# Patient Record
Sex: Female | Born: 1995 | Race: White | Hispanic: No | Marital: Single | Smoking: Current some day smoker
Health system: Southern US, Community
[De-identification: ages and names within clinical notes are randomized; demographics above are authoritative.]

## PROBLEM LIST (undated history)

## (undated) DIAGNOSIS — J45909 Unspecified asthma, uncomplicated: Secondary | ICD-10-CM

## (undated) DIAGNOSIS — F32A Depression, unspecified: Secondary | ICD-10-CM

## (undated) DIAGNOSIS — M419 Scoliosis, unspecified: Secondary | ICD-10-CM

## (undated) DIAGNOSIS — F419 Anxiety disorder, unspecified: Secondary | ICD-10-CM

## (undated) DIAGNOSIS — F429 Obsessive-compulsive disorder, unspecified: Secondary | ICD-10-CM

## (undated) DIAGNOSIS — N83209 Unspecified ovarian cyst, unspecified side: Secondary | ICD-10-CM

## (undated) DIAGNOSIS — D649 Anemia, unspecified: Secondary | ICD-10-CM

## (undated) DIAGNOSIS — F329 Major depressive disorder, single episode, unspecified: Secondary | ICD-10-CM

---
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
## 2016-02-25 ENCOUNTER — Encounter (HOSPITAL_COMMUNITY): Payer: Self-pay | Admitting: *Deleted

## 2016-02-25 ENCOUNTER — Emergency Department (HOSPITAL_COMMUNITY)
Admission: EM | Admit: 2016-02-25 | Discharge: 2016-02-25 | Disposition: A | Discharge status: Home / Self Care | Payer: No Typology Code available for payment source | Attending: Physician Assistant | Admitting: Physician Assistant

## 2016-02-25 DIAGNOSIS — J019 Acute sinusitis, unspecified: Secondary | ICD-10-CM | POA: Insufficient documentation

## 2016-02-25 DIAGNOSIS — J029 Acute pharyngitis, unspecified: Secondary | ICD-10-CM | POA: Insufficient documentation

## 2016-02-25 LAB — RAPID STREP SCREEN (MED CTR MEBANE ONLY): Streptococcus, Group A Screen (Direct): NEGATIVE

## 2016-02-25 MED ORDER — AMOXICILLIN-POT CLAVULANATE 875-125 MG PO TABS: 1.0000 | ORAL_TABLET | Freq: Two times a day (BID) | ORAL | Status: DC

## 2016-02-25 MED ORDER — ACETAMINOPHEN 325 MG PO TABS
ORAL_TABLET | ORAL | Status: AC
  Administered 2016-02-25: 650 mg
  Filled 2016-02-25: qty 2

## 2016-02-25 MED ORDER — MAGIC MOUTHWASH W/LIDOCAINE: 5.0000 mL | Freq: Four times a day (QID) | ORAL | Status: DC | PRN

## 2016-02-25 MED ORDER — IBUPROFEN 600 MG PO TABS: 600.0000 mg | ORAL_TABLET | Freq: Four times a day (QID) | ORAL | Status: DC | PRN

## 2016-02-25 MED ORDER — ACETAMINOPHEN 325 MG PO TABS: 650.0000 mg | ORAL_TABLET | Freq: Once | ORAL | Status: DC | PRN

## 2016-02-25 NOTE — ED Provider Notes (Signed)
CSN: [id number]     Arrival date & time 02/25/16  2005 History   First MD Initiated Contact with Patient 02/25/16 2119     Chief Complaint  Patient presents with  . Sore Throat  . Otalgia     (Consider location/radiation/quality/duration/timing/severity/associated sxs/prior Treatment) HPI Comments: 20 year old female presented to the emergency department for evaluation of sore throat. She states that her sore throat has been persistent for the past week. Symptoms have been associated with nasal congestion, otalgia, and rhinorrhea. She has also had a small amount of postnasal drip. Patient initially thought that her symptoms were secondary to allergies. She has been using Benadryl and Sudafed without relief. She reports a temperature of 101.36F 6 days ago. She did have a low-grade fever of 100.39F yesterday. Patient denies any inability to swallow or drooling. She has had a mild cough without shortness of breath. No vomiting or diarrhea recently. No reported sick contacts.  Patient is a 20 y.o. female presenting with pharyngitis and ear pain. The history is provided by the patient. No language interpreter was used.  Sore Throat Associated symptoms include congestion, a fever (Tmax 101.36F) and a sore throat.  Otalgia Associated symptoms: congestion, fever (Tmax 101.36F), rhinorrhea and sore throat     Past Medical History  Diagnosis Date  . OCD (obsessive compulsive disorder)   . Scoliosis    History reviewed. No pertinent past surgical history. History reviewed. No pertinent family history. Social History  Substance Use Topics  . Smoking status: Never Smoker   . Smokeless tobacco: None  . Alcohol Use: No   OB History    Gravida Para Term Preterm AB TAB SAB Ectopic Multiple Living   0              Review of Systems  Constitutional: Positive for fever (Tmax 101.36F).  HENT: Positive for congestion, ear pain, postnasal drip, rhinorrhea and sore throat.       Allergies  Review  of patient's allergies indicates no known allergies.  Home Medications   Prior to Admission medications   Medication Sig Start Date End Date Taking? Authorizing Provider  albuterol (PROVENTIL HFA;VENTOLIN HFA) 108 (90 Base) MCG/ACT inhaler Inhale 1-2 puffs into the lungs every 6 (six) hours as needed for wheezing or shortness of breath.   Yes Historical Provider, MD  ALPRAZolam Prudy Feeler(XANAX) 1 MG tablet Take 1 mg by mouth 3 (three) times daily as needed for anxiety.   Yes Historical Provider, MD  Ascorbic Acid (VITAMIN C PO) Take 1 tablet by mouth daily.   Yes Historical Provider, MD  cyclobenzaprine (FLEXERIL) 10 MG tablet Take 10 mg by mouth 3 (three) times daily as needed for muscle spasms.   Yes Historical Provider, MD  amoxicillin-clavulanate (AUGMENTIN) 875-125 MG tablet Take 1 tablet by mouth every 12 (twelve) hours. 02/25/16   Antony MaduraKelly Henli Hey, PA-C  ibuprofen (ADVIL,MOTRIN) 600 MG tablet Take 1 tablet (600 mg total) by mouth every 6 (six) hours as needed. 02/25/16   Antony MaduraKelly Kawhi Diebold, PA-C  magic mouthwash w/lidocaine SOLN Take 5 mLs by mouth 4 (four) times daily as needed for mouth pain. Gargle and swallow for sore throat 02/25/16   Antony MaduraKelly Ayaansh Smail, PA-C   BP 109/94 mmHg  Pulse 90  Temp(Src) 99 F (37.2 C) (Oral)  Resp 16  Ht 5\' 4"  (1.626 m)  Wt 75.751 kg  BMI 28.65 kg/m2  SpO2 100%  LMP 02/14/2016   Physical Exam  Constitutional: She is oriented to person, place, and time. She appears well-developed  and well-nourished. No distress.  Nontoxic appearing and in no distress.  HENT:  Head: Normocephalic and atraumatic.  Mouth/Throat: Oropharynx is clear and moist. No oropharyngeal exudate.  Uvula midline. Mild posterior oropharyngeal erythema. No edema or exudates. Patient tolerating secretions without difficulty. No tripoding or stridor.  Eyes: Conjunctivae and EOM are normal. No scleral icterus.  Neck: Normal range of motion.  No nuchal rigidity or meningismus  Cardiovascular: Normal rate, regular  rhythm and intact distal pulses.   Pulmonary/Chest: Effort normal and breath sounds normal. No respiratory distress. She has no wheezes. She has no rales.  Respirations even and unlabored.  Musculoskeletal: Normal range of motion.  Neurological: She is alert and oriented to person, place, and time. She exhibits normal muscle tone. Coordination normal.  Skin: Skin is warm and dry. No rash noted. She is not diaphoretic. No erythema. No pallor.  Psychiatric: She has a normal mood and affect. Her behavior is normal.  Nursing note and vitals reviewed.   ED Course  Procedures (including critical care time) Labs Review Labs Reviewed  RAPID STREP SCREEN (NOT AT [facility])  CULTURE, GROUP A STREP [facility])    Imaging Review No results found.   I have personally reviewed and evaluated these images and lab results as part of my medical decision-making.   EKG Interpretation None      MDM   Final diagnoses:  Acute sinusitis, recurrence not specified, unspecified location  Pharyngitis    Patient complaining of symptoms of sinusitis. Moderate symptoms of clear/yellow nasal discharge/congestion and scratchy throat with cough have been present x 1 week. Patient is afebrile today, but reports fever recently. Patient to be discharged with Augmentin and symptomatic treatment. Patient instructions given for warm saline nasal washes. Recommendations given for follow-up with primary care physician. Return precautions given at discharge. Patient discharged in satisfactory condition with no unaddressed concerns.   Filed Vitals:   02/25/16 2016 02/25/16 2121 02/25/16 2130 02/25/16 2145  BP: 109/94  105/79 120/88  Pulse: 90  88 83  Temp: 99 F (37.2 C)     TempSrc: Oral     Resp: 16     Height:  (1.626 m)     Weight: 75.751 kg     SpO2: 100% 100% 100% 98%     Antony Madura, PA-C 02/25/16 2248  Courteney Lyn Mackuen, MD 02/25/16 2307  Courteney Lyn Mackuen, MD 04/12/16 2317

## 2016-02-25 NOTE — Discharge Instructions (Signed)
Sinusitis, Adult °Sinusitis is redness, soreness, and inflammation of the paranasal sinuses. Paranasal sinuses are air pockets within the bones of your face. They are located beneath your eyes, in the middle of your forehead, and above your eyes. In healthy paranasal sinuses, mucus is able to drain out, and air is able to circulate through them by way of your nose. However, when your paranasal sinuses are inflamed, mucus and air can become trapped. This can allow bacteria and other germs to grow and cause infection. °Sinusitis can develop quickly and last only a short time (acute) or continue over a long period (chronic). Sinusitis that lasts for more than 12 weeks is considered chronic. °CAUSES °Causes of sinusitis include: °· Allergies. °· Structural abnormalities, such as displacement of the cartilage that separates your nostrils (deviated septum), which can decrease the air flow through your nose and sinuses and affect sinus drainage. °· Functional abnormalities, such as when the small hairs (cilia) that line your sinuses and help remove mucus do not work properly or are not present. °SIGNS AND SYMPTOMS °Symptoms of acute and chronic sinusitis are the same. The primary symptoms are pain and pressure around the affected sinuses. Other symptoms include: °· Upper toothache. °· Earache. °· Headache. °· Bad breath. °· Decreased sense of smell and taste. °· A cough, which worsens when you are lying flat. °· Fatigue. °· Fever. °· Thick drainage from your nose, which often is green and may contain pus (purulent). °· Swelling and warmth over the affected sinuses. °DIAGNOSIS °Your health care provider will perform a physical exam. During your exam, your health care provider may perform any of the following to help determine if you have acute sinusitis or chronic sinusitis: °· Look in your nose for signs of abnormal growths in your nostrils (nasal polyps). °· Tap over the affected sinus to check for signs of  infection. °· View the inside of your sinuses using an imaging device that has a light attached (endoscope). °If your health care provider suspects that you have chronic sinusitis, one or more of the following tests may be recommended: °· Allergy tests. °· Nasal culture. A sample of mucus is taken from your nose, sent to a lab, and screened for bacteria. °· Nasal cytology. A sample of mucus is taken from your nose and examined by your health care provider to determine if your sinusitis is related to an allergy. °TREATMENT °Most cases of acute sinusitis are related to a viral infection and will resolve on their own within 10 days. Sometimes, medicines are prescribed to help relieve symptoms of both acute and chronic sinusitis. These may include pain medicines, decongestants, nasal steroid sprays, or saline sprays. °However, for sinusitis related to a bacterial infection, your health care provider will prescribe antibiotic medicines. These are medicines that will help kill the bacteria causing the infection. °Rarely, sinusitis is caused by a fungal infection. In these cases, your health care provider will prescribe antifungal medicine. °For some cases of chronic sinusitis, surgery is needed. Generally, these are cases in which sinusitis recurs more than 3 times per year, despite other treatments. °HOME CARE INSTRUCTIONS °· Drink plenty of water. Water helps thin the mucus so your sinuses can drain more easily. °· Use a humidifier. °· Inhale steam 3-4 times a day (for example, sit in the bathroom with the shower running). °· Apply a warm, moist washcloth to your face 3-4 times a day, or as directed by your health care provider. °· Use saline nasal sprays to help   moisten and clean your sinuses.  Take medicines only as directed by your health care provider.  If you were prescribed either an antibiotic or antifungal medicine, finish it all even if you start to feel better. SEEK IMMEDIATE MEDICAL CARE IF:  You have  increasing pain or severe headaches.  You have nausea, vomiting, or drowsiness.  You have swelling around your face.  You have vision problems.  You have a stiff neck.  You have difficulty breathing.   This information is not intended to replace advice given to you by your health care provider. Make sure you discuss any questions you have with your health care provider.   Document Released: 08/04/2005 Document Revised: 08/25/2014 Document Reviewed: 08/19/2011 Elsevier Interactive Patient Education 2016 Else[employer] Inc.  Sore Throat A sore throat is pain, burning, irritation, or scratchiness of the throat. There is often pain or tenderness when swallowing or talking. A sore throat may be accompanied by other symptoms, such as coughing, sneezing, fever, and swollen neck glands. A sore throat is often the first sign of another sickness, such as a cold, flu, strep throat, or mononucleosis (commonly known as mono). Most sore throats go away without medical treatment. CAUSES  The most common causes of a sore throat include:  A viral infection, such as a cold, flu, or mono.  A bacterial infection, such as strep throat, tonsillitis, or whooping cough.  Seasonal allergies.  Dryness in the air.  Irritants, such as smoke or pollution.  Gastroesophageal reflux disease (GERD). HOME CARE INSTRUCTIONS   Only take over-the-counter medicines as directed by your caregiver.  Drink enough fluids to keep your urine clear or pale yellow.  Rest as needed.  Try using throat sprays, lozenges, or sucking on hard candy to ease any pain (if older than 4 years or as directed).  Sip warm liquids, such as broth, herbal tea, or warm water with honey to relieve pain temporarily. You may also eat or drink cold or frozen liquids such as frozen ice pops.  Gargle with salt water (mix 1 tsp salt with 8 oz of water).  Do not smoke and avoid secondhand smoke.  Put a cool-mist humidifier in your bedroom at  night to moisten the air. You can also turn on a hot shower and sit in the bathroom with the door closed for 5-10 minutes. SEEK IMMEDIATE MEDICAL CARE IF:  You have difficulty breathing.  You are unable to swallow fluids, soft foods, or your saliva.  You have increased swelling in the throat.  Your sore throat does not get better in 7 days.  You have nausea and vomiting.  You have a fever or persistent symptoms for more than 2-3 days.  You have a fever and your symptoms suddenly get worse. MAKE SURE YOU:   Understand these instructions.  Will watch your condition.  Will get help right away if you are not doing well or get worse.   This information is not intended to replace advice given to you by your health care provider. Make sure you discuss any questions you have with your health care provider.   Document Released: 09/11/2004 Document Revised: 08/25/2014 Document Reviewed: 04/11/2012 Elsevier Interactive Patient Education [employer] Inc.

## 2016-02-25 NOTE — ED Notes (Signed)
Pt reports sore throat, earache, headache and fever since Sunday. Pt is taking OTC medications without relief. Pt only took benadryl today.

## 2016-02-28 LAB — CULTURE, GROUP A STREP (THRC)

## 2016-04-23 ENCOUNTER — Emergency Department: Payer: No Typology Code available for payment source

## 2016-04-23 ENCOUNTER — Emergency Department
Admission: EM | Admit: 2016-04-23 | Discharge: 2016-04-23 | Disposition: A | Discharge status: Home / Self Care | Payer: No Typology Code available for payment source | Attending: Emergency Medicine | Admitting: Emergency Medicine

## 2016-04-23 DIAGNOSIS — J4521 Mild intermittent asthma with (acute) exacerbation: Secondary | ICD-10-CM | POA: Insufficient documentation

## 2016-04-23 DIAGNOSIS — F17210 Nicotine dependence, cigarettes, uncomplicated: Secondary | ICD-10-CM | POA: Insufficient documentation

## 2016-04-23 DIAGNOSIS — J069 Acute upper respiratory infection, unspecified: Secondary | ICD-10-CM | POA: Insufficient documentation

## 2016-04-23 HISTORY — DX: Unspecified asthma, uncomplicated: J45.909

## 2016-04-23 LAB — BASIC METABOLIC PANEL
ANION GAP: 7 (ref 5–15)
BUN: 18 mg/dL (ref 6–20)
CHLORIDE: 105 mmol/L (ref 101–111)
CO2: 26 mmol/L (ref 22–32)
CREATININE: 0.56 mg/dL (ref 0.44–1.00)
Calcium: 8.7 mg/dL — ABNORMAL LOW (ref 8.9–10.3)
GFR calc non Af Amer: >60 mL/min (ref >60)
Glucose, Bld: 95 mg/dL (ref 65–99)
Potassium: 3.5 mmol/L (ref 3.5–5.1)
SODIUM: 138 mmol/L (ref 135–145)

## 2016-04-23 LAB — CBC WITH DIFFERENTIAL/PLATELET
BASOS ABS: 0.1 10*3/uL (ref 0–0.1)
BASOS PCT: 1 %
EOS ABS: 0.3 10*3/uL (ref 0–0.7)
Eosinophils Relative: 4 %
HEMATOCRIT: 41.6 % (ref 35.0–47.0)
HEMOGLOBIN: 14.7 g/dL (ref 12.0–16.0)
Lymphocytes Relative: 37 %
Lymphs Abs: 2.7 10*3/uL (ref 1.0–3.6)
MCH: 32 pg (ref 26.0–34.0)
MCHC: 35.3 g/dL (ref 32.0–36.0)
MCV: 90.7 fL (ref 80.0–100.0)
Monocytes Absolute: 0.9 10*3/uL (ref 0.2–0.9)
Monocytes Relative: 12 %
NEUTROS ABS: 3.4 10*3/uL (ref 1.4–6.5)
NEUTROS PCT: 46 %
Platelets: 183 10*3/uL (ref 150–440)
RBC: 4.58 MIL/uL (ref 3.80–5.20)
RDW: 12.8 % (ref 11.5–14.5)
WBC: 7.2 10*3/uL (ref 3.6–11.0)

## 2016-04-23 LAB — TROPONIN I

## 2016-04-23 MED ORDER — HYDROCOD POLST-CPM POLST ER 10-8 MG/5ML PO SUER: 5.0000 mL | Freq: Two times a day (BID) | ORAL | 0 refills | Status: DC

## 2016-04-23 MED ORDER — METHYLPREDNISOLONE SODIUM SUCC 125 MG IJ SOLR
125.0000 mg | Freq: Once | INTRAMUSCULAR | Status: AC
  Administered 2016-04-23: 125 mg via INTRAVENOUS
  Filled 2016-04-23: qty 2

## 2016-04-23 MED ORDER — SODIUM CHLORIDE 0.9 % IV SOLN
Freq: Once | INTRAVENOUS | Status: AC
Start: 2016-04-23 — End: 2016-04-23
  Administered 2016-04-23: 09:00:00 via INTRAVENOUS

## 2016-04-23 MED ORDER — IPRATROPIUM-ALBUTEROL 0.5-2.5 (3) MG/3ML IN SOLN
3.0000 mL | Freq: Once | RESPIRATORY_TRACT | Status: AC
  Administered 2016-04-23: 3 mL via RESPIRATORY_TRACT
  Filled 2016-04-23: qty 3

## 2016-04-23 MED ORDER — PREDNISONE 10 MG (21) PO TBPK: 10.0000 mg | ORAL_TABLET | Freq: Every day | ORAL | 0 refills | Status: DC

## 2016-04-23 MED ORDER — ALBUTEROL SULFATE HFA 108 (90 BASE) MCG/ACT IN AERS: 2.0000 | INHALATION_SPRAY | Freq: Four times a day (QID) | RESPIRATORY_TRACT | 2 refills | Status: DC | PRN

## 2016-04-23 NOTE — ED Notes (Signed)
Patient transported to MRI 

## 2016-04-23 NOTE — ED Provider Notes (Signed)
[facility] Emergency Department Provider Note        Time seen: ----------------------------------------- 8:14 AM on 04/23/2016 -----------------------------------------    I have reviewed the triage vital signs and the nursing notes.   HISTORY  Chief Complaint Cough and Emesis    HPI Alexandra Koch is a 20 y.o. female who presents to the ER for cough that started Friday and then began having vomiting on Monday with loss of appetite, chills and fever.Patient is had some sore throat and congestion but the main complaint is cough. She also feels weak and lightheaded. She reports she's been drinking and taking enough in orally but still feels weak and drained.   Past Medical History:  Diagnosis Date  . Asthma   . OCD (obsessive compulsive disorder)   . Scoliosis     There are no active problems to display for this patient.   History reviewed. No pertinent surgical history.  Allergies Review of patient's allergies indicates no known allergies.  Social History Social History  Substance Use Topics  . Smoking status: Current Every Day Smoker    Types: Cigarettes  . Smokeless tobacco: Never Used  . Alcohol use Yes    Review of Systems Constitutional: Negative for fever. ENT: Positive for congestion, sore throat Cardiovascular: Negative for chest pain. Respiratory:Positive for shortness of breath and cough Gastrointestinal: Negative for abdominal pain, positive for vomiting Genitourinary: Negative for dysuria. Musculoskeletal: Negative for back pain. Skin: Negative for rash. Neurological: Negative for headaches, focal weakness or numbness.  10-point ROS otherwise negative.  ____________________________________________   PHYSICAL EXAM:  VITAL SIGNS: ED Triage Vitals [04/23/16 0800]  Enc Vitals Group     BP 125/76     Pulse Rate 94     Resp 20     Temp 98 F (36.7 C)     Temp Source Oral     SpO2 100 %     Weight 164 lb (74.4  kg)     Height 5\' 4"  (1.626 m)     Head Circumference      Peak Flow      Pain Score 6     Pain Loc      Pain Edu?      Excl. in GC?     Constitutional: Alert and oriented. Well appearing and in no distress. Eyes: Conjunctivae are normal. PERRL. Normal extraocular movements. ENT   Head: Normocephalic and atraumatic.   Nose: No congestion/rhinnorhea.   Mouth/Throat: Mucous membranes are moist.   Neck: No stridor. Cardiovascular: Normal rate, regular rhythm. No murmurs, rubs, or gallops. Respiratory: Normal respiratory effort without tachypnea nor retractions. Bilateral wheezing with scattered rhonchi Gastrointestinal: Soft and nontender. Normal bowel sounds Musculoskeletal: Nontender with normal range of motion in all extremities. No lower extremity tenderness nor edema. Neurologic:  Normal speech and language. No gross focal neurologic deficits are appreciated.  Skin:  Skin is warm, dry and intact. No rash noted. Psychiatric: Mood and affect are normal. Speech and behavior are normal.  ____________________________________________  ED COURSE:  Pertinent labs & imaging results that were available during my care of the patient were reviewed by me and considered in my medical decision making (see chart for details). Clinical Course  Patient presents to ER with symptoms of asthma with the edition possibly a bronchitis or URI. We'll assess with basic labs, give IV fluids. She does need a DuoNeb and steroids at this time.  Procedures ____________________________________________   LABS (pertinent positives/negatives)  Labs Reviewed  BASIC  METABOLIC PANEL - Abnormal; Notable for the following:       Result Value   Calcium 8.7 (*)    All other components within normal limits  CBC WITH DIFFERENTIAL/PLATELET  TROPONIN I    RADIOLOGY  Chest x-ray Is unremarkable ____________________________________________  FINAL ASSESSMENT AND PLAN  Asthma, URI  Plan: Patient  with labs and imaging as dictated above. Patient is in no distress, she did require 2 DuoNeb times as well as IV Solu-Medrol. She'll be discharged with an albuterol inhaler, prednisone taper and Tussionex. I do not feel this is bacterial or that this warrants antibiotics. She is stable for discharge.   Emily FilbertWilliams, Taylan Marez E, MD   Note: This dictation was prepared with Dragon dictation. Any transcriptional errors that result from this process are unintentional    Emily FilbertJonathan E Judith Demps, MD 04/23/16 1022

## 2016-04-23 NOTE — ED Triage Notes (Signed)
Pt c/o cough that started on Friday and then began having vomiting on Monday with loss of appetite, chills, fever..Marland Kitchen

## 2016-08-23 ENCOUNTER — Encounter: Payer: Self-pay | Admitting: Emergency Medicine

## 2016-08-23 ENCOUNTER — Emergency Department: Payer: Self-pay

## 2016-08-23 ENCOUNTER — Emergency Department
Admission: EM | Admit: 2016-08-23 | Discharge: 2016-08-23 | Disposition: A | Discharge status: Home / Self Care | Payer: Self-pay | Attending: Student in an Organized Health Care Education/Training Program | Admitting: Student in an Organized Health Care Education/Training Program

## 2016-08-23 DIAGNOSIS — R102 Pelvic and perineal pain: Secondary | ICD-10-CM

## 2016-08-23 DIAGNOSIS — F17210 Nicotine dependence, cigarettes, uncomplicated: Secondary | ICD-10-CM | POA: Insufficient documentation

## 2016-08-23 DIAGNOSIS — J45909 Unspecified asthma, uncomplicated: Secondary | ICD-10-CM | POA: Insufficient documentation

## 2016-08-23 DIAGNOSIS — N83202 Unspecified ovarian cyst, left side: Secondary | ICD-10-CM | POA: Insufficient documentation

## 2016-08-23 HISTORY — DX: Depression, unspecified: F32.A

## 2016-08-23 HISTORY — DX: Anxiety disorder, unspecified: F41.9

## 2016-08-23 HISTORY — DX: Anemia, unspecified: D64.9

## 2016-08-23 HISTORY — DX: Unspecified ovarian cyst, unspecified side: N83.209

## 2016-08-23 HISTORY — DX: Major depressive disorder, single episode, unspecified: F32.9

## 2016-08-23 LAB — CBC
HEMATOCRIT: 41.5 % (ref 35.0–47.0)
HEMOGLOBIN: 14.5 g/dL (ref 12.0–16.0)
MCH: 31.8 pg (ref 26.0–34.0)
MCHC: 34.9 g/dL (ref 32.0–36.0)
MCV: 91 fL (ref 80.0–100.0)
Platelets: 236 10*3/uL (ref 150–440)
RBC: 4.56 MIL/uL (ref 3.80–5.20)
RDW: 12.4 % (ref 11.5–14.5)
WBC: 9.8 10*3/uL (ref 3.6–11.0)

## 2016-08-23 LAB — URINALYSIS, COMPLETE (UACMP) WITH MICROSCOPIC
BILIRUBIN URINE: NEGATIVE
GLUCOSE, UA: NEGATIVE mg/dL
KETONES UR: NEGATIVE mg/dL
Leukocytes, UA: NEGATIVE
Nitrite: NEGATIVE
PROTEIN: 30 mg/dL — AB
Specific Gravity, Urine: 1.025 (ref 1.005–1.030)
pH: 6 (ref 5.0–8.0)

## 2016-08-23 LAB — COMPREHENSIVE METABOLIC PANEL
ALT: 16 U/L (ref 14–54)
ANION GAP: 6 (ref 5–15)
AST: 15 U/L (ref 15–41)
Albumin: 4.3 g/dL (ref 3.5–5.0)
Alkaline Phosphatase: 70 U/L (ref 38–126)
BUN: 14 mg/dL (ref 6–20)
CO2: 24 mmol/L (ref 22–32)
Calcium: 8.7 mg/dL — ABNORMAL LOW (ref 8.9–10.3)
Chloride: 108 mmol/L (ref 101–111)
Creatinine, Ser: 0.6 mg/dL (ref 0.44–1.00)
GFR calc non Af Amer: >60 mL/min (ref >60)
Glucose, Bld: 88 mg/dL (ref 65–99)
POTASSIUM: 3.9 mmol/L (ref 3.5–5.1)
SODIUM: 138 mmol/L (ref 135–145)
Total Bilirubin: 0.6 mg/dL (ref 0.3–1.2)
Total Protein: 7.2 g/dL (ref 6.5–8.1)

## 2016-08-23 LAB — POCT PREGNANCY, URINE: Preg Test, Ur: NEGATIVE

## 2016-08-23 LAB — LIPASE, BLOOD: LIPASE: 22 U/L (ref 11–51)

## 2016-08-23 LAB — HCG, QUANTITATIVE, PREGNANCY: hCG, Beta Chain, Quant, S: <1 m[IU]/mL (ref <5)

## 2016-08-23 MED ORDER — DIAZEPAM 5 MG PO TABS: 5.0000 mg | ORAL_TABLET | Freq: Once | ORAL | Status: DC

## 2016-08-23 MED ORDER — IBUPROFEN 600 MG PO TABS
600.0000 mg | ORAL_TABLET | Freq: Once | ORAL | Status: AC
  Administered 2016-08-23: 600 mg via ORAL
  Filled 2016-08-23: qty 1

## 2016-08-23 MED ORDER — LORAZEPAM 1 MG PO TABS
1.0000 mg | ORAL_TABLET | Freq: Once | ORAL | Status: AC
  Administered 2016-08-23: 1 mg via ORAL
  Filled 2016-08-23: qty 1

## 2016-08-23 MED ORDER — ALBUTEROL SULFATE (2.5 MG/3ML) 0.083% IN NEBU: 5.0000 mg | INHALATION_SOLUTION | Freq: Once | RESPIRATORY_TRACT | Status: DC

## 2016-08-23 NOTE — ED Notes (Signed)
MD Robinson at bedside 

## 2016-08-23 NOTE — ED Notes (Signed)
Patient returned from ultrasound.

## 2016-08-23 NOTE — Discharge Instructions (Signed)
Please follow up with ob/gyn.  Return for worsening symptoms.    You have been seen in the emergency department for emergency care. It is important that you contact your own doctor, specialist or the closest clinic for follow-up care. Please bring this instruction sheet, all medications and X-ray copies with you when you are seen for follow-up care.  Determining the exact cause for all patients with abdominal pain is extremely difficult in the emergency department. Our primary focus is to rule-out immediate life-threatening diseases. If no immediate source of pain is found the definitive diagnosis frequently needs to be determined over time.Many times your primary care physician can determine the cause by following the symptoms over time. Sometimes, specialist are required such as Gastroenterologists, Gynecologists, Urologists or Surgeons. Please return immediately to the Emergency Department for fever>101, Vomiting or Intractable Pain. You should return to the emergency department or see your primary care provider in 12-24hrs if your pain is no better and sooner if your pain becomes worse.

## 2016-08-23 NOTE — ED Notes (Signed)
Patient transported to Ultrasound 
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
## 2016-08-23 NOTE — ED Notes (Signed)
[facility]

## 2016-08-23 NOTE — ED Provider Notes (Addendum)
[facility] Emergency Department Provider Note    First MD Initiated Contact with Patient 08/23/16 [id number]     (approximate)  I have reviewed the triage vital signs and the nursing notes.   HISTORY  Chief Complaint Abdominal Pain and Vaginal Bleeding    HPI Alexandra Koch is a 21 y.o. female with a history of anxiety and OCD as well as depression as well as ovarian cyst presents with 4 days of pelvic cramping associated with vaginal bleeding. Patient states is consistent with her typical vaginal bleeding during menstrual cycles but that is, early. Patient was on the nexplanon but is since expired. Patient essentially active. Denies any nausea or vomiting. No measured fevers.   Past Medical History:  Diagnosis Date  . Anemia   . Anxiety   . Asthma   . Depression   . OCD (obsessive compulsive disorder)   . Ovarian cyst   . Scoliosis    No family history of easy bleeding or bruising. History reviewed. No pertinent surgical history. There are no active problems to display for this patient.     Prior to Admission medications   Medication Sig Start Date End Date Taking? Authorizing Provider  albuterol (PROVENTIL HFA;VENTOLIN HFA) 108 (90 Base) MCG/ACT inhaler Inhale 1-2 puffs into the lungs every 6 (six) hours as needed for wheezing or shortness of breath.    Historical Provider, MD  albuterol (PROVENTIL HFA;VENTOLIN HFA) 108 (90 Base) MCG/ACT inhaler Inhale 2 puffs into the lungs every 6 (six) hours as needed for wheezing or shortness of breath. 04/23/16   Emily FilbertJonathan E Williams, MD  ALPRAZolam Prudy Feeler(XANAX) 1 MG tablet Take 1 mg by mouth 3 (three) times daily as needed for anxiety.    Historical Provider, MD  amoxicillin-clavulanate (AUGMENTIN) 875-125 MG tablet Take 1 tablet by mouth every 12 (twelve) hours. 02/25/16   Antony MaduraKelly Humes, PA-C  Ascorbic Acid (VITAMIN C PO) Take 1 tablet by mouth daily.    Historical Provider, MD  chlorpheniramine-HYDROcodone (TUSSIONEX  PENNKINETIC ER) 10-8 MG/5ML SUER Take 5 mLs by mouth 2 (two) times daily. 04/23/16   Emily FilbertJonathan E Williams, MD  cyclobenzaprine (FLEXERIL) 10 MG tablet Take 10 mg by mouth 3 (three) times daily as needed for muscle spasms.    Historical Provider, MD  ibuprofen (ADVIL,MOTRIN) 600 MG tablet Take 1 tablet (600 mg total) by mouth every 6 (six) hours as needed. 02/25/16   Antony MaduraKelly Humes, PA-C  magic mouthwash w/lidocaine SOLN Take 5 mLs by mouth 4 (four) times daily as needed for mouth pain. Gargle and swallow for sore throat 02/25/16   Antony MaduraKelly Humes, PA-C  predniSONE (STERAPRED UNI-PAK 21 TAB) 10 MG (21) TBPK tablet Take 1 tablet (10 mg total) by mouth daily. Dispense steroid taper pack as directed 04/23/16   Emily FilbertJonathan E Williams, MD    Allergies Patient has no known allergies.    Social History Social History  Substance Use Topics  . Smoking status: Current Some Day Smoker    Types: Cigarettes  . Smokeless tobacco: Never Used  . Alcohol use 1.8 oz/week    3 Glasses of wine per week    Review of Systems Patient denies headaches, rhinorrhea, blurry vision, numbness, shortness of breath, chest pain, edema, cough, abdominal pain, nausea, vomiting, diarrhea, dysuria, fevers, rashes or hallucinations unless otherwise stated above in HPI. ____________________________________________   PHYSICAL EXAM:  VITAL SIGNS: Vitals:   08/23/16 0047 08/23/16 0237  BP: (!) 136/94 114/73  Pulse: (!) 112 80  Resp: 20  18  Temp: 98.2 F (36.8 C) 98.5 F (36.9 C)    Constitutional: Alert and oriented. Well appearing and in no acute distress. Eyes: Conjunctivae are normal. PERRL. EOMI. Head: Atraumatic. Nose: No congestion/rhinnorhea. Mouth/Throat: Mucous membranes are moist.  Oropharynx non-erythematous. Neck: No stridor. Painless ROM. No cervical spine tenderness to palpation Hematological/Lymphatic/Immunilogical: No cervical lymphadenopathy. Cardiovascular: Normal rate, regular rhythm. Grossly normal heart  sounds.  Good peripheral circulation. Respiratory: Normal respiratory effort.  No retractions. Lungs CTAB. Gastrointestinal: Soft and nontender. No distention. No abdominal bruits. No CVA tenderness. Musculoskeletal: No lower extremity tenderness nor edema.  No joint effusions. Neurologic:  Normal speech and language. No gross focal neurologic deficits are appreciated. No gait instability. Skin:  Skin is warm, dry and intact. No rash noted. Psychiatric: Mood and affect are normal. Speech and behavior are normal.  ____________________________________________   LABS (all labs ordered are listed, but only abnormal results are displayed)  Results for orders placed or performed during the hospital encounter of 08/23/16 (from the past 24 hour(s))  hCG, quantitative, pregnancy     Status: None   Collection Time: 08/23/16 12:57 AM  Result Value Ref Range   hCG, Beta Chain, Quant, S <1 <5 mIU/mL  Lipase, blood     Status: None   Collection Time: 08/23/16 12:57 AM  Result Value Ref Range   Lipase 22 11 - 51 U/L  Comprehensive metabolic panel     Status: Abnormal   Collection Time: 08/23/16 12:57 AM  Result Value Ref Range   Sodium 138 135 - 145 mmol/L   Potassium 3.9 3.5 - 5.1 mmol/L   Chloride 108 101 - 111 mmol/L   CO2 24 22 - 32 mmol/L   Glucose, Bld 88 65 - 99 mg/dL   BUN 14 6 - 20 mg/dL   Creatinine, Ser 6.96 0.44 - 1.00 mg/dL   Calcium 8.7 (L) 8.9 - 10.3 mg/dL   Total Protein 7.2 6.5 - 8.1 g/dL   Albumin 4.3 3.5 - 5.0 g/dL   AST 15 15 - 41 U/L   ALT 16 14 - 54 U/L   Alkaline Phosphatase 70 38 - 126 U/L   Total Bilirubin 0.6 0.3 - 1.2 mg/dL   GFR calc non Af Amer >60 >60 mL/min   GFR calc Af Amer >60 >60 mL/min   Anion gap 6 5 - 15  CBC     Status: None   Collection Time: 08/23/16 12:57 AM  Result Value Ref Range   WBC 9.8 3.6 - 11.0 K/uL   RBC 4.56 3.80 - 5.20 MIL/uL   Hemoglobin 14.5 12.0 - 16.0 g/dL   HCT 29.5 28.4 - 13.2 %   MCV 91.0 80.0 - 100.0 fL   MCH 31.8 26.0 -  34.0 pg   MCHC 34.9 32.0 - 36.0 g/dL   RDW 44.0 10.2 - 72.5 %   Platelets 236 150 - 440 K/uL  Urinalysis, Complete w Microscopic     Status: Abnormal   Collection Time: 08/23/16 12:57 AM  Result Value Ref Range   Color, Urine YELLOW (A) YELLOW   APPearance CLOUDY (A) CLEAR   Specific Gravity, Urine 1.025 1.005 - 1.030   pH 6.0 5.0 - 8.0   Glucose, UA NEGATIVE NEGATIVE mg/dL   Hgb urine dipstick LARGE (A) NEGATIVE   Bilirubin Urine NEGATIVE NEGATIVE   Ketones, ur NEGATIVE NEGATIVE mg/dL   Protein, ur 30 (A) NEGATIVE mg/dL   Nitrite NEGATIVE NEGATIVE   Leukocytes, UA NEGATIVE NEGATIVE   RBC /  HPF 6-30 0 - 5 RBC/hpf   WBC, UA 6-30 0 - 5 WBC/hpf   Bacteria, UA FEW (A) NONE SEEN   Squamous Epithelial / LPF 6-30 (A) NONE SEEN   Mucous PRESENT   Pregnancy, urine POC     Status: None   Collection Time: 08/23/16  1:06 AM  Result Value Ref Range   Preg Test, Ur NEGATIVE NEGATIVE   ____________________________________________  ____________________________________________  RADIOLOGY  I personally reviewed all radiographic images ordered to evaluate for the above acute complaints and reviewed radiology reports and findings.  These findings were personally discussed with the patient.  Please see medical record for radiology report.  ____________________________________________   PROCEDURES  Procedure(s) performed:  Procedures    Critical Care performed: no ____________________________________________   INITIAL IMPRESSION / ASSESSMENT AND PLAN / ED COURSE  Pertinent labs & imaging results that were available during my care of the patient were reviewed by me and considered in my medical decision making (see chart for details).  DDX: dub, ectopic, pcos, torsion  Alexandra Koch is a 21 y.o. who presents to the ED with vaginal bleeding and pelvic cramping. Patient afebrile hemodynamic stable. Does appear very anxious. Patient is not pregnant. Blood work otherwise reassuring.  Patient does have a history of ovarian cysts in stating that pain is colicky in nature. His pain is been ongoing for the past 4 days feel that this is less consistent with acute torsion but will order ultrasound evaluate for very large cyst or evidence of torsion.  This is clinically not consistent with PID or any infectious process. Patient denies any fevers or vaginal discharge.  Clinical Course as of Aug 23 748  Sat Aug 23, 2016  2440 Ultrasound results shows no evidence of torsion. No evidence of free fluid. No evidence of significant cyst causing patient's discomfort. Patient with benign abdominal exam and feels much improved. Offered oral cocci 2 pills but discussed results and benefits with this and need for compliance and desire for longer-term therapy will defer treatment for outpatient follow up with GYN to discuss further management.  Patient was able to tolerate PO and was able to ambulate with a steady gait.  Have discussed with the patient and available family all diagnostics and treatments performed thus far and all questions were answered to the best of my ability. The patient demonstrates understanding and agreement with plan.   [PR]    Clinical Course User Index [PR] Willy Eddy, MD     ____________________________________________   FINAL CLINICAL IMPRESSION(S) / ED DIAGNOSES  Final diagnoses:  Pelvic pain  Cyst of left ovary      NEW MEDICATIONS STARTED DURING THIS VISIT:  New Prescriptions   No medications on file     Note:  This document was prepared using Dragon voice recognition software and may include unintentional dictation errors.    Willy Eddy, MD 08/23/16 1027    Willy Eddy, MD 08/23/16 [phone]

## 2016-08-23 NOTE — ED Notes (Signed)
Patient c/o vaginal bleeding and pelvic cramping. Pt reports that she is not due for her period until the 20th. Pt her prior period ended prior to New Year's Eve. Pt reports hx of spotting outside her cycle and previous cysts. Pt also reports having birth control implant in her arm that no longer feels like it is anchored correctly.

## 2017-05-25 ENCOUNTER — Emergency Department: Payer: Self-pay

## 2017-05-25 ENCOUNTER — Emergency Department
Admission: EM | Admit: 2017-05-25 | Discharge: 2017-05-25 | Disposition: A | Discharge status: Home / Self Care | Payer: Self-pay | Attending: Emergency Medicine | Admitting: Emergency Medicine

## 2017-05-25 DIAGNOSIS — K529 Noninfective gastroenteritis and colitis, unspecified: Secondary | ICD-10-CM | POA: Insufficient documentation

## 2017-05-25 DIAGNOSIS — J45909 Unspecified asthma, uncomplicated: Secondary | ICD-10-CM | POA: Insufficient documentation

## 2017-05-25 DIAGNOSIS — F17210 Nicotine dependence, cigarettes, uncomplicated: Secondary | ICD-10-CM | POA: Insufficient documentation

## 2017-05-25 DIAGNOSIS — Z79899 Other long term (current) drug therapy: Secondary | ICD-10-CM | POA: Insufficient documentation

## 2017-05-25 LAB — URINALYSIS, COMPLETE (UACMP) WITH MICROSCOPIC
BACTERIA UA: NONE SEEN
BILIRUBIN URINE: NEGATIVE
Glucose, UA: NEGATIVE mg/dL
HGB URINE DIPSTICK: NEGATIVE
Ketones, ur: NEGATIVE mg/dL
LEUKOCYTES UA: NEGATIVE
NITRITE: NEGATIVE
PROTEIN: 30 mg/dL — AB
SPECIFIC GRAVITY, URINE: 1.029 (ref 1.005–1.030)
pH: 5 (ref 5.0–8.0)

## 2017-05-25 LAB — COMPREHENSIVE METABOLIC PANEL
ALBUMIN: 4.3 g/dL (ref 3.5–5.0)
ALT: 15 U/L (ref 14–54)
ANION GAP: 10 (ref 5–15)
AST: 18 U/L (ref 15–41)
Alkaline Phosphatase: 78 U/L (ref 38–126)
BUN: 13 mg/dL (ref 6–20)
CHLORIDE: 105 mmol/L (ref 101–111)
CO2: 22 mmol/L (ref 22–32)
Calcium: 9.4 mg/dL (ref 8.9–10.3)
Creatinine, Ser: 0.44 mg/dL (ref 0.44–1.00)
GFR calc Af Amer: >60 mL/min (ref >60)
GFR calc non Af Amer: >60 mL/min (ref >60)
GLUCOSE: 102 mg/dL — AB (ref 65–99)
POTASSIUM: 3.8 mmol/L (ref 3.5–5.1)
SODIUM: 137 mmol/L (ref 135–145)
Total Bilirubin: 0.5 mg/dL (ref 0.3–1.2)
Total Protein: 7.5 g/dL (ref 6.5–8.1)

## 2017-05-25 LAB — CBC
HEMATOCRIT: 44.9 % (ref 35.0–47.0)
HEMOGLOBIN: 15.6 g/dL (ref 12.0–16.0)
MCH: 31.4 pg (ref 26.0–34.0)
MCHC: 34.8 g/dL (ref 32.0–36.0)
MCV: 90.2 fL (ref 80.0–100.0)
Platelets: 223 10*3/uL (ref 150–440)
RBC: 4.97 MIL/uL (ref 3.80–5.20)
RDW: 12.3 % (ref 11.5–14.5)
WBC: 12.1 10*3/uL — ABNORMAL HIGH (ref 3.6–11.0)

## 2017-05-25 LAB — POCT PREGNANCY, URINE: Preg Test, Ur: NEGATIVE

## 2017-05-25 LAB — POCT RAPID STREP A: STREPTOCOCCUS, GROUP A SCREEN (DIRECT): NEGATIVE

## 2017-05-25 LAB — LIPASE, BLOOD: LIPASE: 22 U/L (ref 11–51)

## 2017-05-25 MED ORDER — ONDANSETRON 4 MG PO TBDP: 4.0000 mg | ORAL_TABLET | Freq: Three times a day (TID) | ORAL | 0 refills | Status: DC | PRN

## 2017-05-25 NOTE — ED Provider Notes (Signed)
[facility] Emergency Department Provider Note   ____________________________________________    I have reviewed the triage vital signs and the nursing notes.   HISTORY  Chief Complaint Fever; Emesis; and Diarrhea     HPI Alexandra Koch is a 21 y.o. female Who presents with complaints of nausea vomiting and diarrhea. Patient reports symptoms have been severe over the last 2 days. She has not taken anything for this. Vomitus is nonbilious, nonbloody. Watery diarrhea. Intermittent abdominal cramping, none now. No sick contacts reported. No recent travel. also reports myalgias. Additionally she is concerned she may have a boil on her inner thigh   Past Medical History:  Diagnosis Date  . Anemia   . Anxiety   . Asthma   . Depression   . OCD (obsessive compulsive disorder)   . Ovarian cyst   . Scoliosis     There are no active problems to display for this patient.   History reviewed. No pertinent surgical history.  Prior to Admission medications   Medication Sig Start Date End Date Taking? Authorizing Provider  albuterol (PROVENTIL HFA;VENTOLIN HFA) 108 (90 Base) MCG/ACT inhaler Inhale 1-2 puffs into the lungs every 6 (six) hours as needed for wheezing or shortness of breath.    [provider]  albuterol (PROVENTIL HFA;VENTOLIN HFA) 108 (90 Base) MCG/ACT inhaler Inhale 2 puffs into the lungs every 6 (six) hours as needed for wheezing or shortness of breath. 04/23/16   Emily Filbert, MD  ALPRAZolam Prudy Feeler) 1 MG tablet Take 1 mg by mouth 3 (three) times daily as needed for anxiety.    [provider]  amoxicillin-clavulanate (AUGMENTIN) 875-125 MG tablet Take 1 tablet by mouth every 12 (twelve) hours. 02/25/16   Antony Madura, PA-C  Ascorbic Acid (VITAMIN C PO) Take 1 tablet by mouth daily.    [provider]  chlorpheniramine-HYDROcodone (TUSSIONEX PENNKINETIC ER) 10-8 MG/5ML SUER Take 5 mLs by mouth 2 (two) times daily.  04/23/16   Emily Filbert, MD  cyclobenzaprine (FLEXERIL) 10 MG tablet Take 10 mg by mouth 3 (three) times daily as needed for muscle spasms.    [provider]  ibuprofen (ADVIL,MOTRIN) 600 MG tablet Take 1 tablet (600 mg total) by mouth every 6 (six) hours as needed. 02/25/16   Antony Madura, PA-C  magic mouthwash w/lidocaine SOLN Take 5 mLs by mouth 4 (four) times daily as needed for mouth pain. Gargle and swallow for sore throat 02/25/16   Antony Madura, PA-C  ondansetron (ZOFRAN ODT) 4 MG disintegrating tablet Take 1 tablet (4 mg total) by mouth every 8 (eight) hours as needed for nausea or vomiting. 05/25/17   Jene Every, MD  predniSONE (STERAPRED UNI-PAK 21 TAB) 10 MG (21) TBPK tablet Take 1 tablet (10 mg total) by mouth daily. Dispense steroid taper pack as directed 04/23/16   Emily Filbert, MD     Allergies Patient has no known allergies.  History reviewed. No pertinent family history.  Social History Social History  Substance Use Topics  . Smoking status: Current Some Day Smoker    Types: Cigarettes  . Smokeless tobacco: Never Used  . Alcohol use 1.8 oz/week    3 Glasses of wine per week    Review of Systems  Constitutional: positive chills Eyes: No visual changes.  ENT: mild sore throat Cardiovascular: Denies chest pain. Respiratory: Denies shortness of breath. Gastrointestinal: as above Genitourinary: Negative for dysuria. Musculoskeletal: Nmyalgias Skin: boil Neurological: Negative for headaches  ____________________________________________   PHYSICAL EXAM:  VITAL SIGNS: ED Triage Vitals  Enc Vitals Group     BP 05/25/17 1328 134/80     Pulse Rate 05/25/17 1328 81     Resp 05/25/17 1328 16     Temp 05/25/17 1328 98.3 F (36.8 C)     Temp Source 05/25/17 1328 Oral     SpO2 05/25/17 1328 99 %     Weight --      Height --      Head Circumference --      Peak Flow --      Pain Score 05/25/17 1331 5     Pain Loc --      Pain Edu? --       Excl. in GC? --     Constitutional: Alert and oriented. No acute distress. Pleasant and interactive Eyes: Conjunctivae are normal.   Nose: No congestion/rhinnorhea. Mouth/Throat: Mucous membranes are moist.  pharynx normal  Cardiovascular: Normal rate, regular rhythm. Grossly normal heart sounds.  Good peripheral circulation. Respiratory: Normal respiratory effort.  No retractions. Lungs CTAB. Gastrointestinal: Soft and nontender. No distention.  No CVA tenderness. Genitourinary: deferred Musculoskeletal: No lower extremity tenderness nor edema.  Warm and well perfused Neurologic:  Normal speech and language. No gross focal neurologic deficits are appreciated.  Skin:  Skin is warm, dry and intact. less than 0.5 cm early abscess right inner thigh. No drainable collection Psychiatric: Mood and affect are normal. Speech and behavior are normal.  ____________________________________________   LABS (all labs ordered are listed, but only abnormal results are displayed)  Labs Reviewed  COMPREHENSIVE METABOLIC PANEL - Abnormal; Notable for the following:       Result Value   Glucose, Bld 102 (*)    All other components within normal limits  CBC - Abnormal; Notable for the following:    WBC 12.1 (*)    All other components within normal limits  URINALYSIS, COMPLETE (UACMP) WITH MICROSCOPIC - Abnormal; Notable for the following:    Color, Urine YELLOW (*)    APPearance HAZY (*)    Protein, ur 30 (*)    Squamous Epithelial / LPF 6-30 (*)    All other components within normal limits  LIPASE, BLOOD  POCT RAPID STREP A  POCT PREGNANCY, URINE   ____________________________________________  EKG  None ____________________________________________  RADIOLOGY  chest x-ray unremarkable ____________________________________________   PROCEDURES  Procedure(s) performed: No    Critical Care performed:No ____________________________________________   INITIAL IMPRESSION /  ASSESSMENT AND PLAN / ED COURSE  Pertinent labs & imaging results that were available during my care of the patient were reviewed by me and considered in my medical decision making (see chart for details).  patient presents with nausea vomiting and diarrhea. Differential diagnosis includes viral gastroenteritis (common in the community at this time), gastritis, food poisoning, dehydration  lab work is overall reassuring.history of present illness  most consistent with viral gastroenteritis, we will treat supportively with nausea medication and rest. Return precautions discussed    ____________________________________________   FINAL CLINICAL IMPRESSION(S) / ED DIAGNOSES  Final diagnoses:  Gastroenteritis      NEW MEDICATIONS STARTED DURING THIS VISIT:  Discharge Medication List as of 05/25/2017  2:46 PM    START taking these medications   Details  ondansetron (ZOFRAN ODT) 4 MG disintegrating tablet Take 1 tablet (4 mg total) by mouth every 8 (eight) hours as needed for nausea or vomiting., Starting Mon 05/25/2017, Print  Note:  This document was prepared using Dragon voice recognition software and may include unintentional dictation errors.    Jene Every, MD 05/25/17 [phone]

## 2017-05-25 NOTE — ED Triage Notes (Signed)
Pt presents to ED for cough, sore throat, N&V&D, fever x 1.5 weeks. Was taking motrin and tylenol at home. Last dose tylenol at 7am. Also c/o abscess to R inner thigh. Alert, oriented, ambulatory. No distress noted.

## 2017-05-25 NOTE — ED Notes (Signed)
Pt reports that she has an abscess on her right thigh - has been present for 1 week - no drainage at this time Pt reports V/D/sore throat - vomited x5 in 24 hours - diarrhea x6 in 24 hours - fever max 102

## 2017-08-09 ENCOUNTER — Emergency Department: Payer: Self-pay

## 2017-08-09 ENCOUNTER — Other Ambulatory Visit: Payer: Self-pay

## 2017-08-09 ENCOUNTER — Encounter: Payer: Self-pay | Admitting: Emergency Medicine

## 2017-08-09 ENCOUNTER — Emergency Department
Admission: EM | Admit: 2017-08-09 | Discharge: 2017-08-09 | Disposition: A | Discharge status: Home / Self Care | Payer: Self-pay | Attending: Emergency Medicine | Admitting: Emergency Medicine

## 2017-08-09 DIAGNOSIS — Y939 Activity, unspecified: Secondary | ICD-10-CM | POA: Insufficient documentation

## 2017-08-09 DIAGNOSIS — Y92009 Unspecified place in unspecified non-institutional (private) residence as the place of occurrence of the external cause: Secondary | ICD-10-CM

## 2017-08-09 DIAGNOSIS — W19XXXA Unspecified fall, initial encounter: Secondary | ICD-10-CM

## 2017-08-09 DIAGNOSIS — Z79899 Other long term (current) drug therapy: Secondary | ICD-10-CM | POA: Insufficient documentation

## 2017-08-09 DIAGNOSIS — W01198A Fall on same level from slipping, tripping and stumbling with subsequent striking against other object, initial encounter: Secondary | ICD-10-CM | POA: Insufficient documentation

## 2017-08-09 DIAGNOSIS — J45909 Unspecified asthma, uncomplicated: Secondary | ICD-10-CM | POA: Insufficient documentation

## 2017-08-09 DIAGNOSIS — Y929 Unspecified place or not applicable: Secondary | ICD-10-CM | POA: Insufficient documentation

## 2017-08-09 DIAGNOSIS — Y999 Unspecified external cause status: Secondary | ICD-10-CM | POA: Insufficient documentation

## 2017-08-09 DIAGNOSIS — S46911A Strain of unspecified muscle, fascia and tendon at shoulder and upper arm level, right arm, initial encounter: Secondary | ICD-10-CM | POA: Insufficient documentation

## 2017-08-09 DIAGNOSIS — F17210 Nicotine dependence, cigarettes, uncomplicated: Secondary | ICD-10-CM | POA: Insufficient documentation

## 2017-08-09 DIAGNOSIS — T148XXA Other injury of unspecified body region, initial encounter: Secondary | ICD-10-CM

## 2017-08-09 MED ORDER — IBUPROFEN 800 MG PO TABS: 800.0000 mg | ORAL_TABLET | Freq: Three times a day (TID) | ORAL | 0 refills | Status: AC | PRN

## 2017-08-09 NOTE — ED Provider Notes (Signed)
[facility] Emergency Department Provider Note  ____________________________________________   First MD Initiated Contact with Patient 08/09/17 1320     (approximate)  I have reviewed the triage vital signs and the nursing notes.   HISTORY  Chief Complaint Fall    HPI Alexandra Koch is a 21 y.o. female complains of right upper arm pain, states she fell the other day and landed with her arm against a basket, states that is where her implant is then today she fell again and is having excruciating pain, states that implant is expired and though she needs to get it taken out, she denies head injury, wrist pain, elbow pain, or neck pain, she has no other complaints  Past Medical History:  Diagnosis Date  . Anemia   . Anxiety   . Asthma   . Depression   . OCD (obsessive compulsive disorder)   . Ovarian cyst   . Scoliosis     There are no active problems to display for this patient.   History reviewed. No pertinent surgical history.  Prior to Admission medications   Medication Sig Start Date End Date Taking? Authorizing Provider  ALPRAZolam Prudy Feeler(XANAX) 1 MG tablet Take 1 mg by mouth 3 (three) times daily as needed for anxiety.    [provider]  Ascorbic Acid (VITAMIN C PO) Take 1 tablet by mouth daily.    [provider]  cyclobenzaprine (FLEXERIL) 10 MG tablet Take 10 mg by mouth 3 (three) times daily as needed for muscle spasms.    [provider]  ibuprofen (ADVIL,MOTRIN) 800 MG tablet Take 1 tablet (800 mg total) by mouth every 8 (eight) hours as needed. 08/09/17   Sherrie MustacheFisher, Roselyn BeringSusan W, PA-C  ondansetron (ZOFRAN ODT) 4 MG disintegrating tablet Take 1 tablet (4 mg total) by mouth every 8 (eight) hours as needed for nausea or vomiting. 05/25/17   Jene EveryKinner, Robert, MD  predniSONE (STERAPRED UNI-PAK 21 TAB) 10 MG (21) TBPK tablet Take 1 tablet (10 mg total) by mouth daily. Dispense steroid taper pack as directed 04/23/16   Emily FilbertWilliams, Jonathan  E, MD    Allergies Patient has no known allergies.  History reviewed. No pertinent family history.  Social History Social History   Tobacco Use  . Smoking status: Current Some Day Smoker    Types: Cigarettes  . Smokeless tobacco: Never Used  Substance Use Topics  . Alcohol use: Yes    Alcohol/week: 1.8 oz    Types: 3 Glasses of wine per week  . Drug use: Yes    Types: Marijuana    Review of Systems  Constitutional: No fever/chills Eyes: No visual changes. ENT: No sore throat. Respiratory: Denies cough Genitourinary: Negative for dysuria. Musculoskeletal: Negative for back pain.  Positive for right arm pain Skin: Negative for rash.    ____________________________________________   PHYSICAL EXAM:  VITAL SIGNS: ED Triage Vitals  Enc Vitals Group     BP 08/09/17 1202 125/86     Pulse Rate 08/09/17 1201 93     Resp 08/09/17 1201 18     Temp 08/09/17 1201 98.2 F (36.8 C)     Temp Source 08/09/17 1201 Oral     SpO2 08/09/17 1201 99 %     Weight 08/09/17 1201 176 lb (79.8 kg)     Height 08/09/17 1201 5\' 4"  (1.626 m)     Head Circumference --      Peak Flow --      Pain Score 08/09/17 1200 8  Pain Loc --      Pain Edu? --      Excl. in GC? --     Constitutional: Alert and oriented. Well appearing and in no acute distress. Eyes: Conjunctivae are normal.  Head: Atraumatic. Nose: No congestion/rhinnorhea. Mouth/Throat: Mucous membranes are moist.   Cardiovascular: Normal rate, regular rhythm.  Heart sounds are normal Respiratory: Normal respiratory effort.  No retractions, lungs are clear to auscultation GU: deferred Musculoskeletal: FROM all extremities, warm and well perfused, right upper arm is tender at the medial aspect, the implant is not palpable, patient has full range of motion of the shoulder and left elbow, neurovascular is intact Neurologic:  Normal speech and language.  Skin:  Skin is warm, dry and intact. No rash noted.  No bruising is noted  on the right upper arm Psychiatric: Mood and affect are normal. Speech and behavior are normal.  ____________________________________________   LABS (all labs ordered are listed, but only abnormal results are displayed)  Labs Reviewed  POC URINE PREG, ED   ____________________________________________   ____________________________________________  RADIOLOGY  X-ray of the right humerus is negative, the implant appears to be in normal position ____________________________________________   PROCEDURES  Procedure(s) performed: No      ____________________________________________   INITIAL IMPRESSION / ASSESSMENT AND PLAN / ED COURSE  Pertinent labs & imaging results that were available during my care of the patient were reviewed by me and considered in my medical decision making (see chart for details).  Patient is a 21 year old female complaining of right upper arm pain after a fall, x-rays negative, the implant is in correct position, prescription for ibuprofen 800 mg 3 times daily with food was given, she is to apply ice to the area, patient states she understands and will comply with the recommendations, she is discharged in stable condition     ____________________________________________   FINAL CLINICAL IMPRESSION(S) / ED DIAGNOSES  Final diagnoses:  Fall in home, initial encounter  Muscle strain      NEW MEDICATIONS STARTED DURING THIS VISIT:  This SmartLink is deprecated. Use AVSMEDLIST instead to display the medication list for a patient.   Note:  This document was prepared using Dragon voice recognition software and may include unintentional dictation errors.    Faythe GheeFisher, Swayze Pries W, PA-C 08/09/17 1402    Faythe GheeFisher, Anylah Scheib W, PA-C 08/09/17 1403    Governor RooksLord, Rebecca, MD 08/09/17 [phone]

## 2017-08-09 NOTE — ED Triage Notes (Addendum)
Pt here for fall. 2 mechanical fall this week; sidewalk is broken per pt.  Landed on right arm Thursday with first fall and then caught self with arms today.  Has nexplenon implant to right arm and reports that is where pain is. No bruising or redness noted. Moving right arm to carry coat and wallet without difficulty.

## 2017-08-09 NOTE — Discharge Instructions (Signed)
Follow-up with your regular doctor if you are not better in 3-5 days, you should go to the health department to ask if they can remove your implant, her x-ray is normal, take ibuprofen as instructed

## 2017-08-09 NOTE — ED Notes (Signed)
Patient states she "fell on her right arm a few days ago and was seen here" but she fell on it again this morning and she insists that she "messed up her nexplanon implant" Patient has full AROM to BUE

## 2017-08-12 LAB — POCT PREGNANCY, URINE: Preg Test, Ur: NEGATIVE

## 2018-03-21 ENCOUNTER — Other Ambulatory Visit: Payer: Self-pay

## 2018-03-21 ENCOUNTER — Emergency Department
Admission: EM | Admit: 2018-03-21 | Discharge: 2018-03-21 | Disposition: A | Discharge status: Home / Self Care | Payer: Self-pay | Attending: Student in an Organized Health Care Education/Training Program | Admitting: Student in an Organized Health Care Education/Training Program

## 2018-03-21 ENCOUNTER — Emergency Department: Payer: Self-pay

## 2018-03-21 ENCOUNTER — Encounter: Payer: Self-pay | Admitting: Emergency Medicine

## 2018-03-21 DIAGNOSIS — N201 Calculus of ureter: Secondary | ICD-10-CM | POA: Insufficient documentation

## 2018-03-21 DIAGNOSIS — J45909 Unspecified asthma, uncomplicated: Secondary | ICD-10-CM | POA: Insufficient documentation

## 2018-03-21 DIAGNOSIS — F17210 Nicotine dependence, cigarettes, uncomplicated: Secondary | ICD-10-CM | POA: Insufficient documentation

## 2018-03-21 DIAGNOSIS — Z79899 Other long term (current) drug therapy: Secondary | ICD-10-CM | POA: Insufficient documentation

## 2018-03-21 DIAGNOSIS — R102 Pelvic and perineal pain: Secondary | ICD-10-CM | POA: Insufficient documentation

## 2018-03-21 LAB — URINALYSIS, COMPLETE (UACMP) WITH MICROSCOPIC
BILIRUBIN URINE: NEGATIVE
Glucose, UA: NEGATIVE mg/dL
Ketones, ur: NEGATIVE mg/dL
Nitrite: NEGATIVE
Protein, ur: 100 mg/dL — AB
SPECIFIC GRAVITY, URINE: 1.027 (ref 1.005–1.030)
Squamous Epithelial / LPF: >50 — ABNORMAL HIGH (ref 0–5)
pH: 6 (ref 5.0–8.0)

## 2018-03-21 LAB — CBC
HEMATOCRIT: 42.8 % (ref 35.0–47.0)
Hemoglobin: 15.3 g/dL (ref 12.0–16.0)
MCH: 32.6 pg (ref 26.0–34.0)
MCHC: 35.7 g/dL (ref 32.0–36.0)
MCV: 91.1 fL (ref 80.0–100.0)
Platelets: 240 10*3/uL (ref 150–440)
RBC: 4.7 MIL/uL (ref 3.80–5.20)
RDW: 12.4 % (ref 11.5–14.5)
WBC: 14.3 10*3/uL — AB (ref 3.6–11.0)

## 2018-03-21 LAB — COMPREHENSIVE METABOLIC PANEL
ALBUMIN: 4.4 g/dL (ref 3.5–5.0)
ALT: 15 U/L (ref 0–44)
AST: 21 U/L (ref 15–41)
Alkaline Phosphatase: 77 U/L (ref 38–126)
Anion gap: 11 (ref 5–15)
BUN: 11 mg/dL (ref 6–20)
CHLORIDE: 109 mmol/L (ref 98–111)
CO2: 21 mmol/L — ABNORMAL LOW (ref 22–32)
Calcium: 9 mg/dL (ref 8.9–10.3)
Creatinine, Ser: 0.55 mg/dL (ref 0.44–1.00)
GFR calc Af Amer: >60 mL/min (ref >60)
GFR calc non Af Amer: >60 mL/min (ref >60)
GLUCOSE: 120 mg/dL — AB (ref 70–99)
POTASSIUM: 3.7 mmol/L (ref 3.5–5.1)
Sodium: 141 mmol/L (ref 135–145)
Total Bilirubin: 0.6 mg/dL (ref 0.3–1.2)
Total Protein: 7.4 g/dL (ref 6.5–8.1)

## 2018-03-21 LAB — POCT PREGNANCY, URINE: PREG TEST UR: NEGATIVE

## 2018-03-21 LAB — LIPASE, BLOOD: LIPASE: 24 U/L (ref 11–51)

## 2018-03-21 MED ORDER — HYDROCODONE-ACETAMINOPHEN 5-325 MG PO TABS: 1.0000 | ORAL_TABLET | ORAL | 0 refills | Status: DC | PRN

## 2018-03-21 MED ORDER — PROMETHAZINE HCL 25 MG/ML IJ SOLN
12.5000 mg | Freq: Four times a day (QID) | INTRAMUSCULAR | Status: DC | PRN
  Administered 2018-03-21: 12.5 mg via INTRAVENOUS
  Filled 2018-03-21: qty 1

## 2018-03-21 MED ORDER — KETOROLAC TROMETHAMINE 30 MG/ML IJ SOLN
15.0000 mg | Freq: Once | INTRAMUSCULAR | Status: AC
  Administered 2018-03-21: 15 mg via INTRAVENOUS
  Filled 2018-03-21: qty 1

## 2018-03-21 MED ORDER — METOCLOPRAMIDE HCL 5 MG/ML IJ SOLN
10.0000 mg | Freq: Once | INTRAMUSCULAR | Status: AC
  Administered 2018-03-21: 10 mg via INTRAVENOUS
  Filled 2018-03-21: qty 2

## 2018-03-21 MED ORDER — HYDROCODONE-ACETAMINOPHEN 5-325 MG PO TABS
1.0000 | ORAL_TABLET | Freq: Once | ORAL | Status: AC
  Administered 2018-03-21: 1 via ORAL
  Filled 2018-03-21: qty 1

## 2018-03-21 MED ORDER — PROMETHAZINE HCL 12.5 MG PO TABS: 12.5000 mg | ORAL_TABLET | Freq: Four times a day (QID) | ORAL | 0 refills | Status: DC | PRN

## 2018-03-21 MED ORDER — MORPHINE SULFATE (PF) 4 MG/ML IV SOLN
4.0000 mg | INTRAVENOUS | Status: DC | PRN
  Administered 2018-03-21: 4 mg via INTRAVENOUS
  Filled 2018-03-21: qty 1

## 2018-03-21 MED ORDER — TAMSULOSIN HCL 0.4 MG PO CAPS: 0.4000 mg | ORAL_CAPSULE | Freq: Every day | ORAL | 0 refills | Status: DC

## 2018-03-21 NOTE — ED Triage Notes (Signed)
Pt to ED via POV c/o lower abdominal pain and vomiting x 1. Pt had implant placed in left arm on Thursday. About 30 minutes PTA pt stated having severe abdominal pain. Pt is very anxious in triage and is screaming in pain, pt hyperventilating

## 2018-03-21 NOTE — ED Provider Notes (Signed)
[facility] Emergency Department Provider Note    First MD Initiated Contact with Patient 03/21/18 1704     (approximate)  I have reviewed the triage vital signs and the nursing notes.   HISTORY  Chief Complaint Abdominal Pain and Emesis    HPI Alexandra Koch is a 22 y.o. female with a history of ovarian cyst as well as anxiety depression presents the ER with chief complaint of left abdominal pain associated with vomiting nausea that started roughly 1 hour prior to arrival.  Did recently have Nexplanon placement on Thursday.  Has never had pain like this before.  Is in the same location as her pain related to left ovarian cyst.  Patient states the pain is 12 out of 10 in severity.  Having difficulty sitting still.  Denies any dysuria or hematuria.  No history of kidney stones.  No fevers at home.    Past Medical History:  Diagnosis Date  . Anemia   . Anxiety   . Asthma   . Depression   . OCD (obsessive compulsive disorder)   . Ovarian cyst   . Scoliosis    No family history on file. History reviewed. No pertinent surgical history. There are no active problems to display for this patient.     Prior to Admission medications   Medication Sig Start Date End Date Taking? Authorizing Provider  ALPRAZolam Prudy Feeler) 1 MG tablet Take 1 mg by mouth 3 (three) times daily as needed for anxiety.   Yes [provider]  Ascorbic Acid (VITAMIN C PO) Take 1 tablet by mouth daily.   Yes [provider]  cyclobenzaprine (FLEXERIL) 10 MG tablet Take 10 mg by mouth 3 (three) times daily as needed for muscle spasms.   Yes [provider]  FLUoxetine (PROZAC) 20 MG capsule Take 20 mg by mouth daily.    [provider]  HYDROcodone-acetaminophen (NORCO) 5-325 MG tablet Take 1 tablet by mouth every 4 (four) hours as needed for moderate pain. 03/21/18   Willy Eddy, MD  ibuprofen (ADVIL,MOTRIN) 800 MG tablet Take 1 tablet (800 mg total)  by mouth every 8 (eight) hours as needed. Patient not taking: Reported on 03/21/2018 08/09/17   Faythe Ghee, PA-C  ondansetron (ZOFRAN ODT) 4 MG disintegrating tablet Take 1 tablet (4 mg total) by mouth every 8 (eight) hours as needed for nausea or vomiting. Patient not taking: Reported on 03/21/2018 05/25/17   Jene Every, MD  predniSONE (STERAPRED UNI-PAK 21 TAB) 10 MG (21) TBPK tablet Take 1 tablet (10 mg total) by mouth daily. Dispense steroid taper pack as directed Patient not taking: Reported on 03/21/2018 04/23/16   Emily Filbert, MD  promethazine (PHENERGAN) 12.5 MG tablet Take 1 tablet (12.5 mg total) by mouth every 6 (six) hours as needed for nausea or vomiting. 03/21/18   Willy Eddy, MD  tamsulosin (FLOMAX) 0.4 MG CAPS capsule Take 1 capsule (0.4 mg total) by mouth daily after supper. 03/21/18   Willy Eddy, MD    Allergies Patient has no known allergies.    Social History Social History   Tobacco Use  . Smoking status: Current Some Day Smoker    Types: Cigarettes  . Smokeless tobacco: Never Used  Substance Use Topics  . Alcohol use: Yes    Alcohol/week: 1.8 oz    Types: 3 Glasses of wine per week  . Drug use: Yes    Types: Marijuana    Review of Systems Patient denies headaches,  rhinorrhea, blurry vision, numbness, shortness of breath, chest pain, edema, cough, abdominal pain, nausea, vomiting, diarrhea, dysuria, fevers, rashes or hallucinations unless otherwise stated above in HPI. ____________________________________________   PHYSICAL EXAM:  VITAL SIGNS: Vitals:   03/21/18 2023 03/21/18 2033  BP: 102/77 116/70  Pulse: 100   Resp: 18   Temp: 97.7 F (36.5 C)   SpO2: 100%     Constitutional: Alert and oriented.  Eyes: Conjunctivae are normal.  Head: Atraumatic. Nose: No congestion/rhinnorhea. Mouth/Throat: Mucous membranes are moist.   Neck: No stridor. Painless Koch.  Cardiovascular: Normal rate, regular rhythm. Grossly normal heart  sounds.  Good peripheral circulation. Respiratory: Normal respiratory effort.  No retractions. Lungs CTAB. Gastrointestinal: Soft with mild ttp in LLQ. No distention. No abdominal bruits. No CVA tenderness. Genitourinary: deferred Musculoskeletal: No lower extremity tenderness nor edema.  No joint effusions. Neurologic:  Normal speech and language. No gross focal neurologic deficits are appreciated. No facial droop Skin:  Skin is warm, dry and intact. No rash noted. Psychiatric: Mood and affect are normal. Speech and behavior are normal.  ____________________________________________   LABS (all labs ordered are listed, but only abnormal results are displayed)  Results for orders placed or performed during the hospital encounter of 03/21/18 (from the past 24 hour(s))  Lipase, blood     Status: None   Collection Time: 03/21/18  4:03 PM  Result Value Ref Range   Lipase 24 11 - 51 U/L  Comprehensive metabolic panel     Status: Abnormal   Collection Time: 03/21/18  4:03 PM  Result Value Ref Range   Sodium 141 135 - 145 mmol/L   Potassium 3.7 3.5 - 5.1 mmol/L   Chloride 109 98 - 111 mmol/L   CO2 21 (L) 22 - 32 mmol/L   Glucose, Bld 120 (H) 70 - 99 mg/dL   BUN 11 6 - 20 mg/dL   Creatinine, Ser 1.61 0.44 - 1.00 mg/dL   Calcium 9.0 8.9 - 09.6 mg/dL   Total Protein 7.4 6.5 - 8.1 g/dL   Albumin 4.4 3.5 - 5.0 g/dL   AST 21 15 - 41 U/L   ALT 15 0 - 44 U/L   Alkaline Phosphatase 77 38 - 126 U/L   Total Bilirubin 0.6 0.3 - 1.2 mg/dL   GFR calc non Af Amer >60 >60 mL/min   GFR calc Af Amer >60 >60 mL/min   Anion gap 11 5 - 15  CBC     Status: Abnormal   Collection Time: 03/21/18  4:03 PM  Result Value Ref Range   WBC 14.3 (H) 3.6 - 11.0 K/uL   RBC 4.70 3.80 - 5.20 MIL/uL   Hemoglobin 15.3 12.0 - 16.0 g/dL   HCT 04.5 40.9 - 81.1 %   MCV 91.1 80.0 - 100.0 fL   MCH 32.6 26.0 - 34.0 pg   MCHC 35.7 32.0 - 36.0 g/dL   RDW 91.4 78.2 - 95.6 %   Platelets 240 150 - 440 K/uL  Urinalysis,  Complete w Microscopic     Status: Abnormal   Collection Time: 03/21/18  4:03 PM  Result Value Ref Range   Color, Urine YELLOW (A) YELLOW   APPearance TURBID (A) CLEAR   Specific Gravity, Urine 1.027 1.005 - 1.030   pH 6.0 5.0 - 8.0   Glucose, UA NEGATIVE NEGATIVE mg/dL   Hgb urine dipstick SMALL (A) NEGATIVE   Bilirubin Urine NEGATIVE NEGATIVE   Ketones, ur NEGATIVE NEGATIVE mg/dL   Protein, ur 213 (A)  NEGATIVE mg/dL   Nitrite NEGATIVE NEGATIVE   Leukocytes, UA SMALL (A) NEGATIVE   RBC / HPF 21-50 0 - 5 RBC/hpf   WBC, UA 11-20 0 - 5 WBC/hpf   Bacteria, UA FEW (A) NONE SEEN   Squamous Epithelial / LPF >50 (H) 0 - 5   Mucus PRESENT   Pregnancy, urine POC     Status: None   Collection Time: 03/21/18  4:08 PM  Result Value Ref Range   Preg Test, Ur NEGATIVE NEGATIVE   ____________________________________________ ____________________________________________  RADIOLOGY  I personally reviewed all radiographic images ordered to evaluate for the above acute complaints and reviewed radiology reports and findings.  These findings were personally discussed with the patient.  Please see medical record for radiology report.  ____________________________________________   PROCEDURES  Procedure(s) performed:  Procedures    Critical Care performed: no ____________________________________________   INITIAL IMPRESSION / ASSESSMENT AND PLAN / ED COURSE  Pertinent labs & imaging results that were available during my care of the patient were reviewed by me and considered in my medical decision making (see chart for details).   DDX: torsion, ectopic, stone, pyelo, uti, pid, toa  Alexandra Koch is a 22 y.o. who presents to the ED with was as described above.  Patient is very uncomfortable appearing.  Differential as described above.  Will provide IV pain medication.  Does have hematuria.  No evidence of clear infection.  Denies any vaginal discharge.  Given her history of left ovarian cyst  will order ultrasound to evaluate for torsion as well as left renal ultrasound and KUB to evaluate for stone.  Will reassess.  Clinical Course as of Mar 21 2100  Wynelle LinkSun Mar 21, 2018  [id number] Discussed and recommended pelvic exam to further evaluate the patient's complaints.  Does not seem clinically consistent with torsion.  Possible kidney stone.  Renal UltraSound is equivocal.  Will provide Toradol and will order CT imaging to further evaluate.  Patient declining pelvic exam stating that she just had that but has agreed to self swab.   [PR]  [id number] Patient seen resting comfortably.   [PR]  1909 CT imaging does show evidence of obstructing 3 mm left-sided stone.  Does not look to have evidence of infection.  Mild leukocytosis but she is afebrile.  Has few bacteria with few white cells negative nitrite and greater than 50 squamous cells suggesting contaminant.   [PR]  2028 Reassessed.  Pain is improved.  Feels comfortable at this time.  Still has some discomfort but feels comfortable going home.  She is tolerating oral hydration.  She is not septic.  Does not seem clinically consistent with UTI.  Primarily suggestive of contaminant.  I do believe patient stable and appropriate for trial of outpatient management.   [PR]    Clinical Course User Index [PR] Willy Eddyobinson, Ishmel Acevedo, MD     As part of my medical decision making, I reviewed the following data within the electronic MEDICAL RECORD NUMBER Nursing notes reviewed and incorporated, Labs reviewed, notes from prior ED visits.  ____________________________________________   FINAL CLINICAL IMPRESSION(S) / ED DIAGNOSES  Final diagnoses:  Pelvic pain  Ureterolithiasis      NEW MEDICATIONS STARTED DURING THIS VISIT:  Discharge Medication List as of 03/21/2018  8:30 PM    START taking these medications   Details  HYDROcodone-acetaminophen (NORCO) 5-325 MG tablet Take 1 tablet by mouth every 4 (four) hours as needed for moderate pain., Starting Sun  03/21/2018, Print  promethazine (PHENERGAN) 12.5 MG tablet Take 1 tablet (12.5 mg total) by mouth every 6 (six) hours as needed for nausea or vomiting., Starting Sun 03/21/2018, Print    tamsulosin (FLOMAX) 0.4 MG CAPS capsule Take 1 capsule (0.4 mg total) by mouth daily after supper., Starting Sun 03/21/2018, Print         Note:  This document was prepared using Dragon voice recognition software and may include unintentional dictation errors.    Willy Eddy, MD 03/21/18 2101

## 2018-03-21 NOTE — ED Notes (Signed)
Pt asked this nurse when she can take her bandage off after her birth control implant in her left arm that she had placed this past week. Pt informed she would need to follow up with the provider that inserted it concerning after care instructions.

## 2018-03-21 NOTE — ED Triage Notes (Signed)
First Nurse Note:  C/O lower abdominal pain and emesis x 1.  Patient AAOx3.  Skin warm and dry. States has history of anxiety.  NAD

## 2019-08-03 ENCOUNTER — Ambulatory Visit: Payer: Self-pay | Admitting: Pharmacy Technician

## 2019-08-03 ENCOUNTER — Other Ambulatory Visit: Payer: Self-pay

## 2019-08-03 DIAGNOSIS — Z79899 Other long term (current) drug therapy: Secondary | ICD-10-CM

## 2019-08-03 NOTE — Progress Notes (Signed)
Completed [facility] application and contract.  Patient agreed to all terms of the [facility] contract.    Patient approved to receive medication assistance at [facility] as long as eligibility criteria continues to be met.    Provided patient with community resource material based on her particular needs.    Fahim Kats J. Shantelle Alles Care Manager [facility]  

## 2019-08-16 ENCOUNTER — Other Ambulatory Visit: Payer: Medicaid Other

## 2019-09-07 ENCOUNTER — Telehealth: Payer: Self-pay | Admitting: Pharmacy Technician

## 2019-09-07 NOTE — Telephone Encounter (Signed)
Received updated proof of income for 2021.  Patient eligible to receive medication assistance at [facility] as long as eligibility requirements continue to be met.  Alexandra Koch Care Manager [facility] 

## 2019-09-13 ENCOUNTER — Other Ambulatory Visit: Payer: Medicaid Other

## 2019-12-31 ENCOUNTER — Emergency Department
Admission: EM | Admit: 2019-12-31 | Discharge: 2020-01-01 | Disposition: A | Discharge status: Home / Self Care | Payer: Medicaid Other | Attending: Emergency Medicine | Admitting: Emergency Medicine

## 2019-12-31 ENCOUNTER — Emergency Department: Payer: Medicaid Other

## 2019-12-31 ENCOUNTER — Other Ambulatory Visit: Payer: Self-pay

## 2019-12-31 DIAGNOSIS — J45909 Unspecified asthma, uncomplicated: Secondary | ICD-10-CM | POA: Insufficient documentation

## 2019-12-31 DIAGNOSIS — F17210 Nicotine dependence, cigarettes, uncomplicated: Secondary | ICD-10-CM | POA: Insufficient documentation

## 2019-12-31 DIAGNOSIS — Z79899 Other long term (current) drug therapy: Secondary | ICD-10-CM | POA: Insufficient documentation

## 2019-12-31 DIAGNOSIS — Z113 Encounter for screening for infections with a predominantly sexual mode of transmission: Secondary | ICD-10-CM | POA: Insufficient documentation

## 2019-12-31 DIAGNOSIS — N760 Acute vaginitis: Secondary | ICD-10-CM | POA: Insufficient documentation

## 2019-12-31 DIAGNOSIS — N23 Unspecified renal colic: Secondary | ICD-10-CM

## 2019-12-31 DIAGNOSIS — B9689 Other specified bacterial agents as the cause of diseases classified elsewhere: Secondary | ICD-10-CM

## 2019-12-31 LAB — CBC
HCT: 45.2 % (ref 36.0–46.0)
Hemoglobin: 16.1 g/dL — ABNORMAL HIGH (ref 12.0–15.0)
MCH: 32.3 pg (ref 26.0–34.0)
MCHC: 35.6 g/dL (ref 30.0–36.0)
MCV: 90.8 fL (ref 80.0–100.0)
Platelets: 276 10*3/uL (ref 150–400)
RBC: 4.98 MIL/uL (ref 3.87–5.11)
RDW: 11.9 % (ref 11.5–15.5)
WBC: 9.2 10*3/uL (ref 4.0–10.5)
nRBC: 0 % (ref 0.0–0.2)

## 2019-12-31 LAB — WET PREP, GENITAL
Sperm: NONE SEEN
Trich, Wet Prep: NONE SEEN
Yeast Wet Prep HPF POC: NONE SEEN

## 2019-12-31 LAB — COMPREHENSIVE METABOLIC PANEL
ALT: 11 U/L (ref 0–44)
AST: 16 U/L (ref 15–41)
Albumin: 4.7 g/dL (ref 3.5–5.0)
Alkaline Phosphatase: 75 U/L (ref 38–126)
Anion gap: 11 (ref 5–15)
BUN: 9 mg/dL (ref 6–20)
CO2: 19 mmol/L — ABNORMAL LOW (ref 22–32)
Calcium: 9.2 mg/dL (ref 8.9–10.3)
Chloride: 110 mmol/L (ref 98–111)
Creatinine, Ser: 0.64 mg/dL (ref 0.44–1.00)
GFR calc Af Amer: >60 mL/min (ref >60)
GFR calc non Af Amer: >60 mL/min (ref >60)
Glucose, Bld: 102 mg/dL — ABNORMAL HIGH (ref 70–99)
Potassium: 3.8 mmol/L (ref 3.5–5.1)
Sodium: 140 mmol/L (ref 135–145)
Total Bilirubin: 1 mg/dL (ref 0.3–1.2)
Total Protein: 7.7 g/dL (ref 6.5–8.1)

## 2019-12-31 LAB — URINALYSIS, COMPLETE (UACMP) WITH MICROSCOPIC
Bacteria, UA: NONE SEEN
Bilirubin Urine: NEGATIVE
Glucose, UA: NEGATIVE mg/dL
Ketones, ur: 5 mg/dL — AB
Nitrite: NEGATIVE
Protein, ur: 30 mg/dL — AB
RBC / HPF: >50 RBC/hpf — ABNORMAL HIGH (ref 0–5)
Specific Gravity, Urine: 1.023 (ref 1.005–1.030)
pH: 5 (ref 5.0–8.0)

## 2019-12-31 LAB — POCT PREGNANCY, URINE: Preg Test, Ur: NEGATIVE

## 2019-12-31 LAB — LIPASE, BLOOD: Lipase: 24 U/L (ref 11–51)

## 2019-12-31 MED ORDER — ONDANSETRON 4 MG PO TBDP
ORAL_TABLET | ORAL | Status: AC
  Filled 2019-12-31: qty 1

## 2019-12-31 MED ORDER — HYDROCODONE-ACETAMINOPHEN 5-325 MG PO TABS
2.0000 | ORAL_TABLET | Freq: Once | ORAL | Status: AC
  Administered 2019-12-31: 2 via ORAL
  Filled 2019-12-31: qty 2

## 2019-12-31 MED ORDER — ONDANSETRON 4 MG PO TBDP
4.0000 mg | ORAL_TABLET | Freq: Once | ORAL | Status: AC | PRN
  Administered 2019-12-31: 4 mg via ORAL

## 2019-12-31 MED ORDER — TAMSULOSIN HCL 0.4 MG PO CAPS: 0.4000 mg | ORAL_CAPSULE | Freq: Every day | ORAL | 0 refills | Status: AC

## 2019-12-31 MED ORDER — HYDROCODONE-ACETAMINOPHEN 5-325 MG PO TABS: 1.0000 | ORAL_TABLET | Freq: Four times a day (QID) | ORAL | 0 refills | Status: DC | PRN

## 2019-12-31 MED ORDER — SODIUM CHLORIDE 0.9 % IV BOLUS
1000.0000 mL | Freq: Once | INTRAVENOUS | Status: AC
  Administered 2019-12-31: 1000 mL via INTRAVENOUS

## 2019-12-31 MED ORDER — KETOROLAC TROMETHAMINE 30 MG/ML IJ SOLN
30.0000 mg | Freq: Once | INTRAMUSCULAR | Status: AC
  Administered 2019-12-31: 30 mg via INTRAVENOUS
  Filled 2019-12-31: qty 1

## 2019-12-31 MED ORDER — MORPHINE SULFATE (PF) 4 MG/ML IV SOLN
4.0000 mg | Freq: Once | INTRAVENOUS | Status: AC
  Administered 2019-12-31: 4 mg via INTRAVENOUS
  Filled 2019-12-31: qty 1

## 2019-12-31 NOTE — ED Notes (Signed)
Pt states ex-boyfriend was diagnosed with herpes from a blood test. Provider notified that pt would like a blood test.

## 2019-12-31 NOTE — Discharge Instructions (Addendum)
Take motrin for pain   Take Vicodin for severe pain. Do NOT drive with it   Take flomax daily   Take metronidazole (Flagyl) for bacterial vaginosis, a common and NOT sexually transmitted vaginal infection  Stay hydrated   See urology for follow up   Return to ER if you have worse abdominal pain, flank pain, fever.

## 2019-12-31 NOTE — ED Provider Notes (Signed)
-----------------------------------------   11:13 PM on 12/31/2019 -----------------------------------------  Assuming care from Dr. Silverio Lay.  In short, Alexandra Koch is a 24 y.o. female with a chief complaint of RLQ pain.  Refer to the original H&P for additional details.  The current plan of care is to look for CT results.  If negative, pelvic U/S.  Anticipate discharge after completion of workup.   ----------------------------------------- 12:29 AM on 01/01/2020 -----------------------------------------  Dr. Silverio Lay spoke with the patient about the presence of a kidney stone and gave all the usual customary plan and precautions.  I followed up on the wet prep which was positive for clue cells so I provided prescription for metronidazole and the patient is being discharged with the appropriate prescriptions and instructions.   [name], MD 01/01/20 [id number]

## 2019-12-31 NOTE — ED Triage Notes (Signed)
Patient c/o LRQ abdominal pain and N/V. Patient reports hx of ovarian cysts and kidney stones.

## 2019-12-31 NOTE — ED Provider Notes (Signed)
Alexandra Koch Provider Note   CSN: [id number] Arrival date & time: 12/31/19  1937     History Chief Complaint  Patient presents with  . Abdominal Pain    Alexandra Koch is a 24 y.o. female hx of kidney stone, ovarian cyst she appears to have a sudden onset right lower quadrant pain.  Patient states that she has sharp right lower quadrant pain acute onset this evening.  She states that she immediately cramped up and started crying.  She states that it is sharp and 10/10.  She states that this is similar to her previous kidney stone.  She denies any vaginal discharge or bleeding.  She states that she is sexually active.  She states that one of her partners recently tested positive for herpes in his blood.  She denies any vaginal itchiness or lesions.  The history is provided by the patient.       Past Medical History:  Diagnosis Date  . Anemia   . Anxiety   . Asthma   . Depression   . OCD (obsessive compulsive disorder)   . Ovarian cyst   . Scoliosis     There are no problems to display for this patient.   History reviewed. No pertinent surgical history.   OB History    Gravida  0   Para      Term      Preterm      AB      Living        SAB      TAB      Ectopic      Multiple      Live Births              No family history on file.  Social History   Tobacco Use  . Smoking status: Current Some Day Smoker    Types: Cigarettes  . Smokeless tobacco: Never Used  Substance Use Topics  . Alcohol use: Yes    Alcohol/week: 3.0 standard drinks    Types: 3 Glasses of wine per week  . Drug use: Yes    Types: Marijuana    Home Medications Prior to Admission medications   Medication Sig Start Date End Date Taking? Authorizing Provider  ALPRAZolam Prudy Feeler) 1 MG tablet Take 1 mg by mouth 3 (three) times daily as needed for anxiety.    [provider]  Ascorbic Acid (VITAMIN C PO) Take 1 tablet by mouth  daily.    [provider]  cyclobenzaprine (FLEXERIL) 10 MG tablet Take 10 mg by mouth 3 (three) times daily as needed for muscle spasms.    [provider]  FLUoxetine (PROZAC) 20 MG capsule Take 20 mg by mouth daily.    [provider]  HYDROcodone-acetaminophen (NORCO/VICODIN) 5-325 MG tablet Take 1 tablet by mouth every 6 (six) hours as needed for moderate pain. 12/31/19   [name], MD  ibuprofen (ADVIL,MOTRIN) 800 MG tablet Take 1 tablet (800 mg total) by mouth every 8 (eight) hours as needed. Patient not taking: Reported on 03/21/2018 08/09/17   Faythe Ghee, PA-C  ondansetron (ZOFRAN ODT) 4 MG disintegrating tablet Take 1 tablet (4 mg total) by mouth every 8 (eight) hours as needed for nausea or vomiting. Patient not taking: Reported on 03/21/2018 05/25/17   Jene Every, MD  predniSONE (STERAPRED UNI-PAK 21 TAB) 10 MG (21) TBPK tablet Take 1 tablet (10 mg total) by mouth daily. Dispense steroid taper  pack as directed Patient not taking: Reported on 03/21/2018 04/23/16   Earleen Newport, MD  promethazine (PHENERGAN) 12.5 MG tablet Take 1 tablet (12.5 mg total) by mouth every 6 (six) hours as needed for nausea or vomiting. 03/21/18   Merlyn Lot, MD  tamsulosin (FLOMAX) 0.4 MG CAPS capsule Take 1 capsule (0.4 mg total) by mouth daily. 12/31/19   Drenda Freeze, MD    Allergies    Patient has no known allergies.  Review of Systems   Review of Systems  Gastrointestinal: Positive for abdominal pain.  All other systems reviewed and are negative.   Physical Exam Updated Vital Signs BP 106/78   Pulse 64   Temp 98.4 F (36.9 C) (Oral)   Resp 18   Ht 5\' 4"  (1.626 m)   Wt 79.8 kg   SpO2 97%   BMI 30.20 kg/m   Physical Exam Vitals and nursing note reviewed.  Constitutional:      Comments: Uncomfortable, curled up in pain   HENT:     Head: Normocephalic.     Mouth/Throat:     Mouth: Mucous membranes are moist.  Eyes:     Extraocular  Movements: Extraocular movements intact.  Cardiovascular:     Rate and Rhythm: Normal rate and regular rhythm.     Heart sounds: Normal heart sounds.  Pulmonary:     Effort: Pulmonary effort is normal.     Breath sounds: Normal breath sounds.  Abdominal:     General: Abdomen is flat.     Comments: + RLQ tenderness, no rebound, mild R CVAT   Skin:    General: Skin is warm.  Neurological:     General: No focal deficit present.  Psychiatric:        Mood and Affect: Mood normal.     ED Results / Procedures / Treatments   Labs (all labs ordered are listed, but only abnormal results are displayed) Labs Reviewed  COMPREHENSIVE METABOLIC PANEL - Abnormal; Notable for the following components:      Result Value   CO2 19 (*)    Glucose, Bld 102 (*)    All other components within normal limits  CBC - Abnormal; Notable for the following components:   Hemoglobin 16.1 (*)    All other components within normal limits  URINALYSIS, COMPLETE (UACMP) WITH MICROSCOPIC - Abnormal; Notable for the following components:   Color, Urine AMBER (*)    APPearance CLOUDY (*)    Hgb urine dipstick LARGE (*)    Ketones, ur 5 (*)    Protein, ur 30 (*)    Leukocytes,Ua TRACE (*)    RBC / HPF >50 (*)    All other components within normal limits  WET PREP, GENITAL  CHLAMYDIA/NGC RT PCR ([facility] ONLY)  LIPASE, BLOOD  HSV 2 ANTIBODY, IGG  POC URINE PREG, ED  POCT PREGNANCY, URINE    EKG None  Radiology CT Renal Stone Study  Result Date: 12/31/2019 CLINICAL DATA:  Severe right lower quadrant pain EXAM: CT ABDOMEN AND PELVIS WITHOUT CONTRAST TECHNIQUE: Multidetector CT imaging of the abdomen and pelvis was performed following the standard protocol without IV contrast. COMPARISON:  CT abdomen pelvis 03/21/2018 FINDINGS: Lower chest: Lung bases are clear. Normal heart size. No pericardial effusion. Hepatobiliary: No focal liver abnormality is seen. No gallstones, gallbladder wall thickening, or  biliary dilatation. Pancreas: Unremarkable. No pancreatic ductal dilatation or surrounding inflammatory changes. Spleen: Normal in size without focal abnormality. Adrenals/Urinary Tract: Normal adrenal glands. Kidneys are  symmetrically positioned. No visible or contour deforming renal lesions. Few punctate calculi are seen in the collecting system of both kidneys. There is slight asymmetric right pelvic fullness and mild ureterectasis with periureteral stranding and a punctate 2 mm calculus at the right ureterovesicular junction. No left obstructive urolithiasis is seen. Urinary bladder is otherwise unremarkable. Stomach/Bowel: Distal esophagus, stomach and duodenal sweep are unremarkable. No small bowel wall thickening or dilatation. No evidence of obstruction. A normal appendix is visualized. There is diffuse pancolonic edematous mural thickening and pericolonic haze particularly of the hepatic flexure and proximal transverse colon. Few noninflamed colonic diverticula. Vascular/Lymphatic: The aorta is normal caliber. No suspicious or enlarged lymph nodes in the included lymphatic chains. Reproductive: Retroverted uterus. Lucency in the vaginal canal, possible menstrual products. No concerning adnexal lesions. Other: No abdominopelvic free fluid or air. No bowel containing hernia. Musculoskeletal: Mild levocurvature of the lumbar spine. Stable bone island in the right ilium. No acute osseous abnormality or suspicious osseous lesion. IMPRESSION: 1. Asymmetric right pelvic fullness without frank hydronephrosis. Additional ureterectasis and with periureteral stranding to the level of a punctate 2 mm calculus at the right ureterovesicular junction. 2. Few punctate nonobstructing calculi in the collecting system of both kidneys. 3. Diffuse pancolonic edematous mural thickening and pericolonic haze particularly of the hepatic flexure and proximal transverse colon, consistent. Could reflect colitis of either infectious or  inflammatory etiology. Electronically Signed   By: Kreg Shropshire M.D.   On: 12/31/2019 23:23    Procedures Procedures (including critical care time)  Medications Ordered in ED Medications  ondansetron (ZOFRAN-ODT) 4 MG disintegrating tablet (has no administration in time range)  HYDROcodone-acetaminophen (NORCO/VICODIN) 5-325 MG per tablet 2 tablet (has no administration in time range)  ondansetron (ZOFRAN-ODT) disintegrating tablet 4 mg (4 mg Oral Given 12/31/19 1946)  sodium chloride 0.9 % bolus 1,000 mL (0 mLs Intravenous Stopped 12/31/19 2234)  ketorolac (TORADOL) 30 MG/ML injection 30 mg (30 mg Intravenous Given 12/31/19 2112)  morphine 4 MG/ML injection 4 mg (4 mg Intravenous Given 12/31/19 2110)    ED Course  I have reviewed the triage vital signs and the nursing notes.  Pertinent labs & imaging results that were available during my care of the patient were reviewed by me and considered in my medical decision making (see chart for details).    MDM Rules/Calculators/A&P                      Alexandra Koch is a 24 y.o. female who presented with right lower quadrant pain.  Acute onset that is sharp.  I think likely renal colic.  Also consider ovarian torsion.  Will get CBC, CMP, UA, CT renal stone. If CT renal stone did not show a kidney stone, will need to get a pelvic exam and ultrasound to rule out torsion.  Patient is sexually active but denies any vaginal discharge and wants to defer pelvic exam right now.  She does however request a blood test for herpes since her partner tested positive in his blood as well.  11:37 PM Pelvic exam performed and wet prep is pending.  No CMT or adnexal tenderness or uterine tenderness.  Patient's labs and urinalysis reviewed. Patient does have blood in her urine and no obvious UTI .  Chemistry functions normal.   CT renal stone showed punctate 2 mm stone, likely explaining her pain.  Pain is controlled currently.  Plan to discharge home with Flomax and  Motrin and Vicodin for pain.  Since this is her second episode of renal colic, will refer to urology.  Wet prep is pending and Dr. York Cerise will follow up with a wet prep.  Final Clinical Impression(s) / ED Diagnoses Final diagnoses:  Renal colic on right side    Rx / DC Orders ED Discharge Orders         Ordered    HYDROcodone-acetaminophen (NORCO/VICODIN) 5-325 MG tablet  Every 6 hours PRN     12/31/19 2335    tamsulosin (FLOMAX) 0.4 MG CAPS capsule  Daily     12/31/19 2335           [name], MD 12/31/19 [phone]

## 2019-12-31 NOTE — ED Notes (Signed)
Pt states she was out at a party and "had 1 shot" and went to the bathroom and started to have severe abdominal pain. Pt states pain to the RLQ. Pt states pain is present on palpation, but does not increase or decrease. Pt states history of kidney stones, IBS and ovarian cyst. Pt states nausea has resolved.

## 2020-01-01 MED ORDER — METRONIDAZOLE 500 MG PO TABS: 500.0000 mg | ORAL_TABLET | Freq: Two times a day (BID) | ORAL | 0 refills | Status: AC

## 2020-01-02 ENCOUNTER — Other Ambulatory Visit: Payer: Self-pay

## 2020-01-02 ENCOUNTER — Emergency Department
Admission: EM | Admit: 2020-01-02 | Discharge: 2020-01-02 | Disposition: A | Discharge status: Home / Self Care | Payer: Medicaid Other | Attending: Emergency Medicine | Admitting: Emergency Medicine

## 2020-01-02 ENCOUNTER — Encounter: Payer: Self-pay | Admitting: Emergency Medicine

## 2020-01-02 DIAGNOSIS — R1110 Vomiting, unspecified: Secondary | ICD-10-CM | POA: Insufficient documentation

## 2020-01-02 DIAGNOSIS — F17210 Nicotine dependence, cigarettes, uncomplicated: Secondary | ICD-10-CM | POA: Insufficient documentation

## 2020-01-02 DIAGNOSIS — J45909 Unspecified asthma, uncomplicated: Secondary | ICD-10-CM | POA: Insufficient documentation

## 2020-01-02 DIAGNOSIS — F1210 Cannabis abuse, uncomplicated: Secondary | ICD-10-CM | POA: Insufficient documentation

## 2020-01-02 DIAGNOSIS — N200 Calculus of kidney: Secondary | ICD-10-CM

## 2020-01-02 LAB — BASIC METABOLIC PANEL
Anion gap: 9 (ref 5–15)
BUN: 11 mg/dL (ref 6–20)
CO2: 22 mmol/L (ref 22–32)
Calcium: 8.7 mg/dL — ABNORMAL LOW (ref 8.9–10.3)
Chloride: 110 mmol/L (ref 98–111)
Creatinine, Ser: 0.57 mg/dL (ref 0.44–1.00)
GFR calc Af Amer: >60 mL/min (ref >60)
GFR calc non Af Amer: >60 mL/min (ref >60)
Glucose, Bld: 108 mg/dL — ABNORMAL HIGH (ref 70–99)
Potassium: 3.8 mmol/L (ref 3.5–5.1)
Sodium: 141 mmol/L (ref 135–145)

## 2020-01-02 LAB — URINALYSIS, COMPLETE (UACMP) WITH MICROSCOPIC
Bacteria, UA: NONE SEEN
Bilirubin Urine: NEGATIVE
Glucose, UA: NEGATIVE mg/dL
Ketones, ur: 20 mg/dL — AB
Nitrite: NEGATIVE
Protein, ur: 30 mg/dL — AB
Specific Gravity, Urine: 1.031 — ABNORMAL HIGH (ref 1.005–1.030)
pH: 5 (ref 5.0–8.0)

## 2020-01-02 LAB — HSV 2 ANTIBODY, IGG: HSV 2 Glycoprotein G Ab, IgG: <0.91 index (ref 0.00–0.90)

## 2020-01-02 LAB — CBC
HCT: 39.4 % (ref 36.0–46.0)
Hemoglobin: 14.3 g/dL (ref 12.0–15.0)
MCH: 32.4 pg (ref 26.0–34.0)
MCHC: 36.3 g/dL — ABNORMAL HIGH (ref 30.0–36.0)
MCV: 89.3 fL (ref 80.0–100.0)
Platelets: 210 10*3/uL (ref 150–400)
RBC: 4.41 MIL/uL (ref 3.87–5.11)
RDW: 12.2 % (ref 11.5–15.5)
WBC: 11.4 10*3/uL — ABNORMAL HIGH (ref 4.0–10.5)
nRBC: 0 % (ref 0.0–0.2)

## 2020-01-02 LAB — POCT PREGNANCY, URINE: Preg Test, Ur: NEGATIVE

## 2020-01-02 MED ORDER — KETOROLAC TROMETHAMINE 30 MG/ML IJ SOLN
15.0000 mg | Freq: Once | INTRAMUSCULAR | Status: AC
  Administered 2020-01-02: 15 mg via INTRAVENOUS
  Filled 2020-01-02: qty 1

## 2020-01-02 MED ORDER — ONDANSETRON HCL 4 MG/2ML IJ SOLN
4.0000 mg | Freq: Once | INTRAMUSCULAR | Status: AC
  Administered 2020-01-02: 4 mg via INTRAVENOUS
  Filled 2020-01-02: qty 2

## 2020-01-02 MED ORDER — SODIUM CHLORIDE 0.9 % IV BOLUS
1000.0000 mL | Freq: Once | INTRAVENOUS | Status: AC
  Administered 2020-01-02: 1000 mL via INTRAVENOUS

## 2020-01-02 MED ORDER — KETOROLAC TROMETHAMINE 10 MG PO TABS: 10.0000 mg | ORAL_TABLET | Freq: Four times a day (QID) | ORAL | 0 refills | Status: DC | PRN

## 2020-01-02 MED ORDER — FENTANYL CITRATE (PF) 100 MCG/2ML IJ SOLN
50.0000 ug | Freq: Once | INTRAMUSCULAR | Status: AC
  Administered 2020-01-02: 50 ug via INTRAVENOUS
  Filled 2020-01-02: qty 2

## 2020-01-02 MED ORDER — LORAZEPAM 2 MG/ML IJ SOLN
1.0000 mg | Freq: Once | INTRAMUSCULAR | Status: AC
  Administered 2020-01-02: 1 mg via INTRAVENOUS
  Filled 2020-01-02: qty 1

## 2020-01-02 NOTE — ED Notes (Addendum)
Pt reports feeling better after ativan - pt looks calm, sleepy. edp notified

## 2020-01-02 NOTE — ED Provider Notes (Signed)
[facility] Emergency Department Provider Note  Time seen: 2:23 PM  I have reviewed the triage vital signs and the nursing notes.   HISTORY  Chief Complaint Flank Pain   HPI Alexandra Koch is a 24 y.o. female with a past medical history of anxiety, anemia, depression, presents to the emergency department for continued right flank pain.  According to the patient and record review patient was seen in the emergency department 2 days ago for the same, had a CT scan showing a right-sided kidney stone.  Patient states she was discharged with pain medication and antibiotics for possible colon infection.  Patient states she has been vomiting and cannot keep down her antibiotics so she came back to the emergency department.  States she is vomiting due to the severe right flank pain.  Patient denies any fever.  States she has had a few episodes of loose stool.  Patient states she has been vomiting and on occasion will see blood within the vomit.  Patient currently retching in the room.   Past Medical History:  Diagnosis Date  . Anemia   . Anxiety   . Asthma   . Depression   . OCD (obsessive compulsive disorder)   . Ovarian cyst   . Scoliosis     There are no problems to display for this patient.   History reviewed. No pertinent surgical history.  Prior to Admission medications   Medication Sig Start Date End Date Taking? Authorizing Provider  ALPRAZolam Duanne Moron) 1 MG tablet Take 1 mg by mouth 3 (three) times daily as needed for anxiety.    [provider]  Ascorbic Acid (VITAMIN C PO) Take 1 tablet by mouth daily.    [provider]  cyclobenzaprine (FLEXERIL) 10 MG tablet Take 10 mg by mouth 3 (three) times daily as needed for muscle spasms.    [provider]  FLUoxetine (PROZAC) 20 MG capsule Take 20 mg by mouth daily.    [provider]  HYDROcodone-acetaminophen (NORCO/VICODIN) 5-325 MG tablet Take 1 tablet by mouth every 6  (six) hours as needed for moderate pain. 12/31/19   Drenda Freeze, MD  ibuprofen (ADVIL,MOTRIN) 800 MG tablet Take 1 tablet (800 mg total) by mouth every 8 (eight) hours as needed. Patient not taking: Reported on 03/21/2018 08/09/17   Versie Starks, PA-C  metroNIDAZOLE (FLAGYL) 500 MG tablet Take 1 tablet (500 mg total) by mouth 2 (two) times daily. 01/01/20   Hinda Kehr, MD  ondansetron (ZOFRAN ODT) 4 MG disintegrating tablet Take 1 tablet (4 mg total) by mouth every 8 (eight) hours as needed for nausea or vomiting. Patient not taking: Reported on 03/21/2018 05/25/17   Lavonia Drafts, MD  predniSONE (STERAPRED UNI-PAK 21 TAB) 10 MG (21) TBPK tablet Take 1 tablet (10 mg total) by mouth daily. Dispense steroid taper pack as directed Patient not taking: Reported on 03/21/2018 04/23/16   Earleen Newport, MD  promethazine (PHENERGAN) 12.5 MG tablet Take 1 tablet (12.5 mg total) by mouth every 6 (six) hours as needed for nausea or vomiting. 03/21/18   Merlyn Lot, MD  tamsulosin (FLOMAX) 0.4 MG CAPS capsule Take 1 capsule (0.4 mg total) by mouth daily. 12/31/19   Drenda Freeze, MD    No Known Allergies  No family history on file.  Social History Social History   Tobacco Use  . Smoking status: Current Some Day Smoker    Types: Cigarettes  . Smokeless tobacco: Never Used  Substance  Use Topics  . Alcohol use: Yes    Alcohol/week: 3.0 standard drinks    Types: 3 Glasses of wine per week  . Drug use: Yes    Types: Marijuana    Review of Systems Constitutional: Negative for fever. Cardiovascular: Negative for chest pain. Respiratory: Negative for shortness of breath. Gastrointestinal: As if her right flank pain.  Positive for nausea vomiting.  Positive for diarrhea. Genitourinary: Negative for urinary compaints Musculoskeletal: Negative for musculoskeletal complaints Neurological: Negative for headache All other ROS  negative  ____________________________________________   PHYSICAL EXAM:  VITAL SIGNS: ED Triage Vitals  Enc Vitals Group     BP 01/02/20 1259 130/82     Pulse Rate 01/02/20 1259 62     Resp 01/02/20 1259 16     Temp 01/02/20 1259 97.8 F (36.6 C)     Temp Source 01/02/20 1259 Oral     SpO2 01/02/20 1259 96 %     Weight 01/02/20 1300 175 lb 14.8 oz (79.8 kg)     Height 01/02/20 1300 5\' 4"  (1.626 m)     Head Circumference --      Peak Flow --      Pain Score 01/02/20 1300 10     Pain Loc --      Pain Edu? --      Excl. in GC? --    Constitutional: Alert and oriented.  Mild distress patient bent over retching. Eyes: Normal exam ENT      Head: Normocephalic and atraumatic.      Mouth/Throat: Mucous membranes are moist. Cardiovascular: Normal rate, regular rhythm.  Respiratory: Normal respiratory effort without tachypnea nor retractions. Breath sounds are clear  Gastrointestinal: Soft, moderate right mid to lower abdominal tenderness palpation without rebound guarding or distention.  No CVA tenderness. Musculoskeletal: Nontender with normal range of motion in all extremities. Neurologic:  Normal speech and language. No gross focal neurologic deficits Skin:  Skin is warm, dry and intact.  Psychiatric: Mood and affect are normal.   ____________________________________________   INITIAL IMPRESSION / ASSESSMENT AND PLAN / ED COURSE  Pertinent labs & imaging results that were available during my care of the patient were reviewed by me and considered in my medical decision making (see chart for details).   Patient presents to the emergency department for right flank pain nausea vomiting and diarrhea.  Patient was seen in the emergency department 2 days ago with a work-up consistent with a 2 mm right ureteral stone.  Also had signs of mild colitis.  Discharged with Flomax pain medication and antibiotics.  Patient states she continued to have pain but this morning began with vomiting  once again.  Patient is actively retching in the emergency department.  Patient's lab work today shows mild amount of ketones within the urine indicating dehydration otherwise no significant changes.  Slight leukocytosis.  Given a CT scan 2 days ago showing what appears to be the cause of her discomfort with a 2 mm right ureteral stone and possible colitis I do not believe repeat CT imaging would be of much utility at this point.  We will treat the patient's symptoms and continue to reassess.  Patient is feeling better.  We will discharge home with Toradol.  Patient has nausea medication and Vicodin at home.  Discussed return precautions.  MICAL BRUN was evaluated in Emergency Department on 01/02/2020 for the symptoms described in the history of present illness. She was evaluated in the context of the global COVID-19 pandemic,  which necessitated consideration that the patient might be at risk for infection with the SARS-CoV-2 virus that causes COVID-19. Institutional protocols and algorithms that pertain to the evaluation of patients at risk for COVID-19 are in a state of rapid change based on information released by regulatory bodies including the CDC and federal and state organizations. These policies and algorithms were followed during the patient's care in the ED.  ____________________________________________   FINAL CLINICAL IMPRESSION(S) / ED DIAGNOSES  Right flank pain Kidney stone    Minna Antis, MD 01/02/20 1824

## 2020-01-02 NOTE — ED Triage Notes (Signed)
C/O RLQ pain since this morning.  States had some vomiting this morning and also vomiting some blood.  AAOx3.  Skin warm and dry.  Crying in triage. NAD

## 2020-01-10 ENCOUNTER — Ambulatory Visit: Payer: Medicaid Other | Admitting: Pharmacist

## 2020-01-10 DIAGNOSIS — Z79899 Other long term (current) drug therapy: Secondary | ICD-10-CM

## 2020-01-10 NOTE — Progress Notes (Addendum)
[facility] Visit Note  Patient: Alexandra Koch MRN: [id number] Date of Birth: July 09, 1996 PCP: Armando Gang, RN (Inactive)   [name] 24 y.o. female presents for a MTM visit today via telephone. Pt was identified by name and DOB.  There were no vitals taken for this visit.  Patient Information   Past Medical History:  Diagnosis Date  . Anemia   . Anxiety   . Asthma   . Depression   . OCD (obsessive compulsive disorder)   . Ovarian cyst   . Scoliosis      History reviewed. No pertinent surgical history.  History reviewed. No pertinent family history.  Outpatient Encounter Medications as of 01/10/2020  Medication Sig  . Ascorbic Acid (VITAMIN C PO) Take 1 tablet by mouth daily.  Marland Kitchen FLUoxetine (PROZAC) 20 MG capsule Take 20 mg by mouth daily. Take one capsule by mouth every morning  . FLUoxetine (PROZAC) 40 MG capsule Take 40 mg by mouth daily. Take one capsule by mouth every morning  . hydrOXYzine (VISTARIL) 50 MG capsule Take 50 mg by mouth 3 (three) times daily as needed.  Marland Kitchen ibuprofen (ADVIL,MOTRIN) 800 MG tablet Take 1 tablet (800 mg total) by mouth every 8 (eight) hours as needed.  . metroNIDAZOLE (FLAGYL) 500 MG tablet Take 1 tablet (500 mg total) by mouth 2 (two) times daily.  . prazosin (MINIPRESS) 2 MG capsule Take 2 mg by mouth at bedtime.  Marland Kitchen QUEtiapine (SEROQUEL) 25 MG tablet Take 25 mg by mouth in the morning.   Marland Kitchen QUEtiapine (SEROQUEL) 25 MG tablet Take 75 mg by mouth at bedtime.  . tamsulosin (FLOMAX) 0.4 MG CAPS capsule Take 1 capsule (0.4 mg total) by mouth daily.  . [DISCONTINUED] ALPRAZolam (XANAX) 1 MG tablet Take 1 mg by mouth 3 (three) times daily as needed for anxiety.  . [DISCONTINUED] cyclobenzaprine (FLEXERIL) 10 MG tablet Take 10 mg by mouth 3 (three) times daily as needed for muscle spasms.  . [DISCONTINUED] HYDROcodone-acetaminophen (NORCO/VICODIN) 5-325 MG tablet Take 1 tablet by mouth every 6 (six) hours as needed for moderate  pain. (Patient not taking: Reported on 01/10/2020)  . [DISCONTINUED] ketorolac (TORADOL) 10 MG tablet Take 1 tablet (10 mg total) by mouth every 6 (six) hours as needed. (Patient not taking: Reported on 01/10/2020)  . [DISCONTINUED] ondansetron (ZOFRAN ODT) 4 MG disintegrating tablet Take 1 tablet (4 mg total) by mouth every 8 (eight) hours as needed for nausea or vomiting. (Patient not taking: Reported on 03/21/2018)  . [DISCONTINUED] predniSONE (STERAPRED UNI-PAK 21 TAB) 10 MG (21) TBPK tablet Take 1 tablet (10 mg total) by mouth daily. Dispense steroid taper pack as directed (Patient not taking: Reported on 03/21/2018)  . [DISCONTINUED] promethazine (PHENERGAN) 12.5 MG tablet Take 1 tablet (12.5 mg total) by mouth every 6 (six) hours as needed for nausea or vomiting. (Patient not taking: Reported on 01/10/2020)   No facility-administered encounter medications on file as of 01/10/2020.   Health Maintenance/Date Completed  Last ED visit: 01/02/2020 Last Visit to PCP: A year ago Dental Exam: 5 years ago Eye Exam: 2 years ago Flu Vaccine: No Pneumonia Vaccine: no COVID-19 Vaccine: no Shingrix Vaccine: no  New Diagnoses (since last visit):   Family Support: Good  Lifestyle Pt states she works out multiple times a week.  Diet: Typically has one meal a day, such as a salad, or quesadilla, she cooks.            Social History   Substance  and Sexual Activity  Alcohol Use Yes  . Alcohol/week: 3.0 standard drinks  . Types: 3 Glasses of wine per week      Social History   Tobacco Use  Smoking Status Current Some Day Smoker  . Packs/day: 0.25  . Types: Cigarettes  Smokeless Tobacco Never Used      Health Maintenance  Topic Date Due  . COVID-19 Vaccine (1) Never done  . CHLAMYDIA SCREENING  02/05/2015  . TETANUS/TDAP  12/07/2016  . PAP-Cervical Cytology Screening  Never done  . PAP SMEAR-Modifier  Never done  . INFLUENZA VACCINE  03/18/2020  . HIV Screening  Completed      Assessment and Plan:  Adherence/Compliance: Pt states for the most part she is adherent to her medications. She would miss a "couple of days" at most but now endorses uses a morning and evening pillbox to help her remember and says it helps a lot. She likes her overall regimen and feels like it's working for her.  Depression/Anxiety: She is content with her current regimen even though she feels like her medications make her tired. She also says she thinks the prozac makes her sweat but nothing that is intolerable. Pt states her mood has been alright and stable.  Smoking Cessation:  Pt states she is currently trying to quit smoking, cold [place]. Informed the patient of resources that could be available to her such as the quit line with access to gum, patches and inhalers. If she needs it. She seemed intrigued and say she would love more information next time she picks up her medication.  Call ~ 30 mins  RTC follow-up 1 year  Willow Ora (Ty) [employer] PharmD Candidate [employer]  Cosigned: Keri K. Dicky Doe, PharmD [facility] [place] Operations Coordinator [phone]

## 2020-01-11 ENCOUNTER — Other Ambulatory Visit: Payer: Self-pay

## 2020-11-05 ENCOUNTER — Telehealth: Payer: Self-pay | Admitting: Pharmacy Technician

## 2020-11-05 NOTE — Telephone Encounter (Signed)
Patient failed to provide 2022 proof of income.  No additional medication assistance will be provided by [facility] without the required proof of income documentation.  Patient notified by letter.  Sherilyn Dacosta Care Manager [facility]   [address] [place], [place]  [address]    November 05, 2020    Natasha Burda [address] [place], [place]  [address]  Dear Marchelle Folks:  This is to inform you that you are no longer eligible to receive medication assistance at [facility].  The reason(s) are:    _____Your total gross monthly household income exceeds 250% of the Federal Poverty Level.   _____Tangible assets (savings, checking, stocks/bonds, pension, retirement, etc.) exceeds our limit  _____You are eligible to receive benefits from [facility], [facility] or HIV Medication              Assistance Program _____You are eligible to receive benefits from a Medicare Part "D" plan _____You have prescription insurance  _____You are not an [facility] resident __X__Failure to provide all requested proof of income information for 2022.    Medication assistance will resume once all requested financial information has been returned to our clinic.  If you have questions, please contact our clinic at [phone].    Thank you,  [facility]

## 2021-01-04 ENCOUNTER — Other Ambulatory Visit: Payer: Self-pay

## 2021-01-22 ENCOUNTER — Other Ambulatory Visit: Payer: Self-pay

## 2021-01-22 ENCOUNTER — Encounter: Payer: Self-pay | Admitting: Emergency Medicine

## 2021-01-22 ENCOUNTER — Emergency Department: Payer: Self-pay

## 2021-01-22 ENCOUNTER — Emergency Department
Admission: EM | Admit: 2021-01-22 | Discharge: 2021-01-22 | Disposition: A | Discharge status: Home / Self Care | Payer: Self-pay | Attending: Emergency Medicine | Admitting: Emergency Medicine

## 2021-01-22 DIAGNOSIS — R112 Nausea with vomiting, unspecified: Secondary | ICD-10-CM | POA: Insufficient documentation

## 2021-01-22 DIAGNOSIS — F17210 Nicotine dependence, cigarettes, uncomplicated: Secondary | ICD-10-CM | POA: Insufficient documentation

## 2021-01-22 DIAGNOSIS — J45909 Unspecified asthma, uncomplicated: Secondary | ICD-10-CM | POA: Insufficient documentation

## 2021-01-22 DIAGNOSIS — R1084 Generalized abdominal pain: Secondary | ICD-10-CM | POA: Insufficient documentation

## 2021-01-22 LAB — HCG, QUANTITATIVE, PREGNANCY: hCG, Beta Chain, Quant, S: <1 m[IU]/mL (ref <5)

## 2021-01-22 LAB — CBC WITH DIFFERENTIAL/PLATELET
Abs Immature Granulocytes: 0.03 10*3/uL (ref 0.00–0.07)
Basophils Absolute: 0 10*3/uL (ref 0.0–0.1)
Basophils Relative: 0 %
Eosinophils Absolute: 0 10*3/uL (ref 0.0–0.5)
Eosinophils Relative: 0 %
HCT: 43.2 % (ref 36.0–46.0)
Hemoglobin: 15.2 g/dL — ABNORMAL HIGH (ref 12.0–15.0)
Immature Granulocytes: 0 %
Lymphocytes Relative: 21 %
Lymphs Abs: 2.1 10*3/uL (ref 0.7–4.0)
MCH: 31.9 pg (ref 26.0–34.0)
MCHC: 35.2 g/dL (ref 30.0–36.0)
MCV: 90.8 fL (ref 80.0–100.0)
Monocytes Absolute: 0.8 10*3/uL (ref 0.1–1.0)
Monocytes Relative: 8 %
Neutro Abs: 6.9 10*3/uL (ref 1.7–7.7)
Neutrophils Relative %: 71 %
Platelets: 285 10*3/uL (ref 150–400)
RBC: 4.76 MIL/uL (ref 3.87–5.11)
RDW: 11.9 % (ref 11.5–15.5)
WBC: 9.8 10*3/uL (ref 4.0–10.5)
nRBC: 0 % (ref 0.0–0.2)

## 2021-01-22 LAB — WET PREP, GENITAL
Clue Cells Wet Prep HPF POC: NONE SEEN
Sperm: NONE SEEN
Trich, Wet Prep: NONE SEEN
WBC, Wet Prep HPF POC: NONE SEEN
Yeast Wet Prep HPF POC: NONE SEEN

## 2021-01-22 LAB — URINALYSIS, COMPLETE (UACMP) WITH MICROSCOPIC
Bacteria, UA: NONE SEEN
Bilirubin Urine: NEGATIVE
Glucose, UA: NEGATIVE mg/dL
Ketones, ur: 20 mg/dL — AB
Leukocytes,Ua: NEGATIVE
Nitrite: NEGATIVE
Protein, ur: NEGATIVE mg/dL
Specific Gravity, Urine: 1.025 (ref 1.005–1.030)
pH: 5 (ref 5.0–8.0)

## 2021-01-22 LAB — COMPREHENSIVE METABOLIC PANEL
ALT: 14 U/L (ref 0–44)
AST: 15 U/L (ref 15–41)
Albumin: 4.5 g/dL (ref 3.5–5.0)
Alkaline Phosphatase: 71 U/L (ref 38–126)
Anion gap: 8 (ref 5–15)
BUN: 9 mg/dL (ref 6–20)
CO2: 22 mmol/L (ref 22–32)
Calcium: 9.2 mg/dL (ref 8.9–10.3)
Chloride: 109 mmol/L (ref 98–111)
Creatinine, Ser: 0.59 mg/dL (ref 0.44–1.00)
GFR, Estimated: >60 mL/min (ref >60)
Glucose, Bld: 98 mg/dL (ref 70–99)
Potassium: 3.8 mmol/L (ref 3.5–5.1)
Sodium: 139 mmol/L (ref 135–145)
Total Bilirubin: 1.2 mg/dL (ref 0.3–1.2)
Total Protein: 7.4 g/dL (ref 6.5–8.1)

## 2021-01-22 LAB — LIPASE, BLOOD: Lipase: 23 U/L (ref 11–51)

## 2021-01-22 LAB — CHLAMYDIA/NGC RT PCR (ARMC ONLY)
Chlamydia Tr: NOT DETECTED
N gonorrhoeae: NOT DETECTED

## 2021-01-22 MED ORDER — DICYCLOMINE HCL 10 MG PO CAPS: 10.0000 mg | ORAL_CAPSULE | Freq: Three times a day (TID) | ORAL | 0 refills | Status: DC | PRN

## 2021-01-22 MED ORDER — ONDANSETRON HCL 4 MG/2ML IJ SOLN
4.0000 mg | Freq: Once | INTRAMUSCULAR | Status: AC
  Administered 2021-01-22: 4 mg via INTRAVENOUS
  Filled 2021-01-22: qty 2

## 2021-01-22 MED ORDER — MORPHINE SULFATE (PF) 4 MG/ML IV SOLN
4.0000 mg | Freq: Once | INTRAVENOUS | Status: AC
Start: 2021-01-22 — End: 2021-01-22
  Administered 2021-01-22: 4 mg via INTRAVENOUS
  Filled 2021-01-22: qty 1

## 2021-01-22 MED ORDER — FENTANYL CITRATE (PF) 100 MCG/2ML IJ SOLN
50.0000 ug | Freq: Once | INTRAMUSCULAR | Status: AC
  Administered 2021-01-22: 50 ug via INTRAVENOUS
  Filled 2021-01-22: qty 2

## 2021-01-22 MED ORDER — DICYCLOMINE HCL 10 MG PO CAPS: 10.0000 mg | ORAL_CAPSULE | Freq: Three times a day (TID) | ORAL | 0 refills | Status: AC | PRN

## 2021-01-22 NOTE — ED Notes (Signed)
See triage note, pt reports worsening abd pain with emesis and nausea. emesis x3 in past 24 hours. Hx IBS Last BM this am, states think constipation Appears uncomfortable Denies urinary sx

## 2021-01-22 NOTE — ED Notes (Signed)
Received report from Brianna, RN 

## 2021-01-22 NOTE — ED Triage Notes (Signed)
Pt to ED via POV with c/o abdominal pain/swelling and emesis x several days, pt states woke up last night and pain was unbearable.

## 2021-01-22 NOTE — ED Provider Notes (Signed)
[facility] Emergency Department Provider Note  ____________________________________________   Event Date/Time   First MD Initiated Contact with Patient 01/22/21 1409     (approximate)  I have reviewed the triage vital signs and the nursing notes.   HISTORY  Chief Complaint Emesis and Abdominal Pain   HPI Alexandra Koch is a 25 y.o. female with past medical history of anemia, anxiety, asthma, depression, OCD and an ovarian cyst who presents for assessment of some abdominal pain associate with some nausea and vomiting.  Patient states her symptoms have been going on for about a week feels like he got much worse over the last 24 hours.  She denies any diarrhea and states she has had very small pellet-like stools.  She denies any burning with urination discharge physician sent little bit of spotting issues with her period.  She has not had any new shortness of breath, chest pain, cough, fevers, headache, earache, sore throat, rash or extremity pain.  No recent falls or injuries.  No other acute concerns at this time.         Past Medical History:  Diagnosis Date  . Anemia   . Anxiety   . Asthma   . Depression   . OCD (obsessive compulsive disorder)   . Ovarian cyst   . Scoliosis     There are no problems to display for this patient.   History reviewed. No pertinent surgical history.  Prior to Admission medications   Medication Sig Start Date End Date Taking? Authorizing Provider  Ascorbic Acid (VITAMIN C PO) Take 1 tablet by mouth daily.    [provider]  FLUoxetine (PROZAC) 20 MG capsule Take 20 mg by mouth daily. Take one capsule by mouth every morning    [provider]  FLUoxetine (PROZAC) 40 MG capsule Take 40 mg by mouth daily. Take one capsule by mouth every morning    [provider]  hydrOXYzine (VISTARIL) 50 MG capsule Take 50 mg by mouth 3 (three) times daily as needed.    [provider]  ibuprofen  (ADVIL,MOTRIN) 800 MG tablet Take 1 tablet (800 mg total) by mouth every 8 (eight) hours as needed. 08/09/17   Fisher, Roselyn Bering, PA-C  metroNIDAZOLE (FLAGYL) 500 MG tablet Take 1 tablet (500 mg total) by mouth 2 (two) times daily. 01/01/20   [name], MD  prazosin (MINIPRESS) 2 MG capsule Take 2 mg by mouth at bedtime.    [provider]  QUEtiapine (SEROQUEL) 25 MG tablet Take 25 mg by mouth in the morning.     [provider]  QUEtiapine (SEROQUEL) 25 MG tablet Take 75 mg by mouth at bedtime.    [provider]  tamsulosin (FLOMAX) 0.4 MG CAPS capsule Take 1 capsule (0.4 mg total) by mouth daily. 12/31/19   [name], MD    Allergies Patient has no known allergies.  History reviewed. No pertinent family history.  Social History Social History   Tobacco Use  . Smoking status: Current Some Day Smoker    Packs/day: 0.25    Types: Cigarettes  . Smokeless tobacco: Never Used  Vaping Use  . Vaping Use: Never used  Substance Use Topics  . Alcohol use: Yes    Alcohol/week: 3.0 standard drinks    Types: 3 Glasses of wine per week  . Drug use: Not Currently    Types: Marijuana    Review of Systems  Review of Systems  Constitutional: Negative for chills  and fever.  HENT: Negative for sore throat.   Eyes: Negative for pain.  Respiratory: Negative for cough and stridor.   Cardiovascular: Negative for chest pain.  Gastrointestinal: Positive for abdominal pain, constipation, nausea and vomiting.  Genitourinary: Negative for dysuria.  Musculoskeletal: Negative for myalgias.  Skin: Negative for rash.  Neurological: Negative for seizures, loss of consciousness and headaches.  Psychiatric/Behavioral: Negative for suicidal ideas.  All other systems reviewed and are negative.     ____________________________________________   PHYSICAL EXAM:  VITAL SIGNS: ED Triage Vitals  Enc Vitals Group     BP      Pulse      Resp      Temp      Temp  src      SpO2      Weight      Height      Head Circumference      Peak Flow      Pain Score      Pain Loc      Pain Edu?      Excl. in GC?    Vitals:   01/22/21 1412  BP: 130/86  Pulse: 89  Resp: 20  Temp: 98 F (36.7 C)  SpO2: 97%   Physical Exam Vitals and nursing note reviewed.  Constitutional:      General: She is not in acute distress.    Appearance: She is well-developed.  HENT:     Head: Normocephalic and atraumatic.     Right Ear: External ear normal.     Left Ear: External ear normal.     Nose: Nose normal.     Mouth/Throat:     Mouth: Mucous membranes are dry.  Eyes:     Conjunctiva/sclera: Conjunctivae normal.  Cardiovascular:     Rate and Rhythm: Normal rate and regular rhythm.     Heart sounds: No murmur heard.   Pulmonary:     Effort: Pulmonary effort is normal. No respiratory distress.     Breath sounds: Normal breath sounds.  Abdominal:     Palpations: Abdomen is soft.     Tenderness: There is generalized abdominal tenderness.  Musculoskeletal:     Cervical back: Neck supple.  Skin:    General: Skin is warm and dry.     Capillary Refill: Capillary refill takes 2 to 3 seconds.  Neurological:     Mental Status: She is alert and oriented to person, place, and time.  Psychiatric:        Mood and Affect: Mood normal.      ____________________________________________   LABS (all labs ordered are listed, but only abnormal results are displayed)  Labs Reviewed  CBC WITH DIFFERENTIAL/PLATELET - Abnormal; Notable for the following components:      Result Value   Hemoglobin 15.2 (*)    All other components within normal limits  COMPREHENSIVE METABOLIC PANEL  LIPASE, BLOOD  HCG, QUANTITATIVE, PREGNANCY  URINALYSIS, COMPLETE (UACMP) WITH MICROSCOPIC  POC URINE PREG, ED   ____________________________________________  EKG  ____________________________________________  RADIOLOGY  ED MD interpretation:    Official radiology  report(s): No results found.  ____________________________________________   PROCEDURES  Procedure(s) performed (including Critical Care):  .1-3 Lead EKG Interpretation Performed by: Gilles Chiquito, MD Authorized by: Gilles Chiquito, MD     Interpretation: normal     ECG rate assessment: normal     Rhythm: sinus rhythm     Ectopy: none     Conduction: normal  ____________________________________________   INITIAL IMPRESSION / ASSESSMENT AND PLAN / ED COURSE        Patient presents with above to history exam for assessment of 1 week of worsening abdominal pain associate with nonbloody vomiting and some constipation.  On arrival she is afebrile and hemodynamically stable.  She does have some generalized abdominal tenderness but no CVA tenderness.   Differential includes cholecystitis, pancreatitis, symptomatic constipation, diverticulitis, SBO possible ovarian cyst.  She has no lower pelvic discomfort and seems more generalized and overall I have a lower suspicion for torsion given overall description of symptoms.  She has no abnormal discharge or other historical features to suggest PID at this time.  CBC unremarkable.  CMP unremarkable.  No significant evidence of cholestasis or hepatitis.  Lipase not consistent with pancreatitis.  hCG unremarkable.  Care patient signed over to oncoming provider approximately 1500.  Plan to follow-up urine and CT and reassess.  If patient is feeling better and these are unremarkable she is likely safe for discharge home with outpatient follow-up.       ____________________________________________   FINAL CLINICAL IMPRESSION(S) / ED DIAGNOSES  Final diagnoses:  Generalized abdominal pain    Medications  ondansetron (ZOFRAN) injection 4 mg (4 mg Intravenous Given 01/22/21 1423)  morphine 4 MG/ML injection 4 mg (4 mg Intravenous Given 01/22/21 1424)     ED Discharge Orders    None       Note:  This document was  prepared using Dragon voice recognition software and may include unintentional dictation errors.   Gilles Chiquito, MD 01/22/21 [phone]

## 2021-01-22 NOTE — ED Provider Notes (Signed)
-----------------------------------------   6:16 PM on 01/22/2021 -----------------------------------------  Patient continues to complain of abdominal pain.  Patient's labs are largely within normal limits, CT scan is negative.  Urinalysis is pending, beta hCG is negative.  I have personally seen the patient as well she states she is having vaginal discharge and spotting at times.  Given the otherwise negative work-up we will perform a pelvic exam to help rule out PID as a possibility.  Patient agreeable to plan of care.  Wet prep is negative.  No significant cervical motion tenderness or cervical erythema.  No significant discharge.  Extremely low suspicion for PID.  Patient's work-up is otherwise reassuring, gonorrhea and chlamydia is pending.  We will discharge the patient home with Bentyl and GI follow-up.  Patient agreeable to plan of care.   Minna Antis, MD 01/22/21 1911

## 2021-01-22 NOTE — Discharge Instructions (Addendum)
Please take your medication as needed for abdominal discomfort, as prescribed.  Please call the number provided for GI medicine to arrange a follow-up appointment as soon as possible.  Return to the emergency department for any worsening abdominal pain, development of fever, or any other symptom personally concerning to yourself.    As we discussed please use over-the-counter MiraLAX as needed for constipation you may take 1 capful with a large glass of water 3 times daily until you have multiple bowel movements.

## 2021-01-22 NOTE — ED Notes (Signed)
Pt provided warm blanket as requested 

## 2021-06-27 ENCOUNTER — Other Ambulatory Visit: Payer: Self-pay

## 2021-07-05 ENCOUNTER — Other Ambulatory Visit: Payer: Self-pay

## 2021-07-05 ENCOUNTER — Encounter: Payer: Self-pay | Admitting: Intensive Care

## 2021-07-05 ENCOUNTER — Emergency Department: Payer: Self-pay

## 2021-07-05 ENCOUNTER — Emergency Department
Admission: EM | Admit: 2021-07-05 | Discharge: 2021-07-06 | Disposition: A | Discharge status: Home / Self Care | Payer: Self-pay | Attending: Emergency Medicine | Admitting: Emergency Medicine

## 2021-07-05 DIAGNOSIS — F17210 Nicotine dependence, cigarettes, uncomplicated: Secondary | ICD-10-CM | POA: Insufficient documentation

## 2021-07-05 DIAGNOSIS — J039 Acute tonsillitis, unspecified: Secondary | ICD-10-CM

## 2021-07-05 DIAGNOSIS — R059 Cough, unspecified: Secondary | ICD-10-CM | POA: Insufficient documentation

## 2021-07-05 DIAGNOSIS — R042 Hemoptysis: Secondary | ICD-10-CM | POA: Insufficient documentation

## 2021-07-05 DIAGNOSIS — Z20822 Contact with and (suspected) exposure to covid-19: Secondary | ICD-10-CM | POA: Insufficient documentation

## 2021-07-05 DIAGNOSIS — Z79899 Other long term (current) drug therapy: Secondary | ICD-10-CM | POA: Insufficient documentation

## 2021-07-05 DIAGNOSIS — H6591 Unspecified nonsuppurative otitis media, right ear: Secondary | ICD-10-CM

## 2021-07-05 DIAGNOSIS — J45909 Unspecified asthma, uncomplicated: Secondary | ICD-10-CM | POA: Insufficient documentation

## 2021-07-05 DIAGNOSIS — J029 Acute pharyngitis, unspecified: Secondary | ICD-10-CM | POA: Insufficient documentation

## 2021-07-05 DIAGNOSIS — H9203 Otalgia, bilateral: Secondary | ICD-10-CM | POA: Insufficient documentation

## 2021-07-05 LAB — CBC WITH DIFFERENTIAL/PLATELET
Abs Immature Granulocytes: 0.06 10*3/uL (ref 0.00–0.07)
Basophils Absolute: 0 10*3/uL (ref 0.0–0.1)
Basophils Relative: 0 %
Eosinophils Absolute: 0.1 10*3/uL (ref 0.0–0.5)
Eosinophils Relative: 1 %
HCT: 42 % (ref 36.0–46.0)
Hemoglobin: 14.8 g/dL (ref 12.0–15.0)
Immature Granulocytes: 0 %
Lymphocytes Relative: 15 %
Lymphs Abs: 2.2 10*3/uL (ref 0.7–4.0)
MCH: 32.9 pg (ref 26.0–34.0)
MCHC: 35.2 g/dL (ref 30.0–36.0)
MCV: 93.3 fL (ref 80.0–100.0)
Monocytes Absolute: 1 10*3/uL (ref 0.1–1.0)
Monocytes Relative: 7 %
Neutro Abs: 10.7 10*3/uL — ABNORMAL HIGH (ref 1.7–7.7)
Neutrophils Relative %: 77 %
Platelets: 228 10*3/uL (ref 150–400)
RBC: 4.5 MIL/uL (ref 3.87–5.11)
RDW: 11.9 % (ref 11.5–15.5)
WBC: 14 10*3/uL — ABNORMAL HIGH (ref 4.0–10.5)
nRBC: 0 % (ref 0.0–0.2)

## 2021-07-05 LAB — COMPREHENSIVE METABOLIC PANEL
ALT: 17 U/L (ref 0–44)
AST: 14 U/L — ABNORMAL LOW (ref 15–41)
Albumin: 4.2 g/dL (ref 3.5–5.0)
Alkaline Phosphatase: 84 U/L (ref 38–126)
Anion gap: 9 (ref 5–15)
BUN: 7 mg/dL (ref 6–20)
CO2: 23 mmol/L (ref 22–32)
Calcium: 9.1 mg/dL (ref 8.9–10.3)
Chloride: 105 mmol/L (ref 98–111)
Creatinine, Ser: 0.53 mg/dL (ref 0.44–1.00)
GFR, Estimated: >60 mL/min (ref >60)
Glucose, Bld: 90 mg/dL (ref 70–99)
Potassium: 3.8 mmol/L (ref 3.5–5.1)
Sodium: 137 mmol/L (ref 135–145)
Total Bilirubin: 1 mg/dL (ref 0.3–1.2)
Total Protein: 7.3 g/dL (ref 6.5–8.1)

## 2021-07-05 LAB — RESP PANEL BY RT-PCR (FLU A&B, COVID) ARPGX2
Influenza A by PCR: NEGATIVE
Influenza B by PCR: NEGATIVE
SARS Coronavirus 2 by RT PCR: NEGATIVE

## 2021-07-05 LAB — GROUP A STREP BY PCR: Group A Strep by PCR: NOT DETECTED

## 2021-07-05 LAB — MONONUCLEOSIS SCREEN: Mono Screen: NEGATIVE

## 2021-07-05 MED ORDER — KETOROLAC TROMETHAMINE 30 MG/ML IJ SOLN
30.0000 mg | Freq: Once | INTRAMUSCULAR | Status: AC
  Administered 2021-07-05: 30 mg via INTRAVENOUS
  Filled 2021-07-05: qty 1

## 2021-07-05 MED ORDER — SODIUM CHLORIDE 0.9 % IV SOLN
3.0000 g | Freq: Once | INTRAVENOUS | Status: AC
  Administered 2021-07-05: 3 g via INTRAVENOUS
  Filled 2021-07-05: qty 8

## 2021-07-05 MED ORDER — LORAZEPAM 1 MG PO TABS
1.0000 mg | ORAL_TABLET | Freq: Once | ORAL | Status: AC
  Administered 2021-07-05: 1 mg via ORAL
  Filled 2021-07-05: qty 1

## 2021-07-05 MED ORDER — SODIUM CHLORIDE 0.9 % IV BOLUS
1000.0000 mL | Freq: Once | INTRAVENOUS | Status: AC
  Administered 2021-07-05: 1000 mL via INTRAVENOUS

## 2021-07-05 MED ORDER — DEXAMETHASONE SODIUM PHOSPHATE 10 MG/ML IJ SOLN
10.0000 mg | Freq: Once | INTRAMUSCULAR | Status: AC
  Administered 2021-07-05: 10 mg via INTRAVENOUS
  Filled 2021-07-05: qty 1

## 2021-07-05 MED ORDER — IOHEXOL 300 MG/ML  SOLN
75.0000 mL | Freq: Once | INTRAMUSCULAR | Status: AC | PRN
  Administered 2021-07-05: 75 mL via INTRAVENOUS

## 2021-07-05 MED ORDER — IOHEXOL 350 MG/ML SOLN: 75.0000 mL | Freq: Once | INTRAVENOUS | Status: DC | PRN

## 2021-07-05 NOTE — ED Provider Notes (Signed)
[facility] Emergency Department Provider Note  ____________________________________________  Time seen: Approximately 11:10 PM  I have reviewed the triage vital signs and the nursing notes.   HISTORY  Chief Complaint Sore Throat (/) and Ear Pain    HPI Alexandra Koch is a 25 y.o. female who presents emergency department complaining of panic attack, sore throat, cough, hemoptysis.  Patient states that she has had cold-like symptoms for several days, developed a worsening sore throat today and feels like "there is razor blades in my throat."  Patient states that she does not have any difficulty breathing but is having difficulty swallowing due to pain.  She states that she also developed hemoptysis today where when she is coughing she is coughing up "strings of blood."  Again no shortness of breath, no chest pain, no abdominal symptoms.  She is had no vomiting, diarrhea or constipation.  Patient is also complaining of bilateral ear pain.  Denies fevers.  History of OCD, asthma, depression, anemia, anxiety.  Patient states that her symptoms are giving her a panic attack.       Past Medical History:  Diagnosis Date   Anemia    Anxiety    Asthma    Depression    OCD (obsessive compulsive disorder)    Ovarian cyst    Scoliosis     There are no problems to display for this patient.   History reviewed. No pertinent surgical history.  Prior to Admission medications   Medication Sig Start Date End Date Taking? Authorizing Provider  Ascorbic Acid (VITAMIN C PO) Take 1 tablet by mouth daily.    [provider]  dicyclomine (BENTYL) 10 MG capsule Take 1 capsule (10 mg total) by mouth 3 (three) times daily as needed for up to 14 days for spasms. 01/22/21 02/05/21  Minna Antis, MD  FLUoxetine (PROZAC) 20 MG capsule Take 20 mg by mouth daily. Take one capsule by mouth every morning    [provider]  FLUoxetine (PROZAC) 40 MG capsule Take 40 mg  by mouth daily. Take one capsule by mouth every morning    [provider]  hydrOXYzine (VISTARIL) 50 MG capsule Take 50 mg by mouth 3 (three) times daily as needed.    [provider]  ibuprofen (ADVIL,MOTRIN) 800 MG tablet Take 1 tablet (800 mg total) by mouth every 8 (eight) hours as needed. 08/09/17   Fisher, Roselyn Bering, PA-C  metroNIDAZOLE (FLAGYL) 500 MG tablet Take 1 tablet (500 mg total) by mouth 2 (two) times daily. 01/01/20   [name], MD  prazosin (MINIPRESS) 2 MG capsule Take 2 mg by mouth at bedtime.    [provider]  QUEtiapine (SEROQUEL) 25 MG tablet Take 25 mg by mouth in the morning.     [provider]  QUEtiapine (SEROQUEL) 25 MG tablet Take 75 mg by mouth at bedtime.    [provider]  tamsulosin (FLOMAX) 0.4 MG CAPS capsule Take 1 capsule (0.4 mg total) by mouth daily. 12/31/19   [name], MD    Allergies Patient has no known allergies.  History reviewed. No pertinent family history.  Social History Social History   Tobacco Use   Smoking status: Some Days    Packs/day: 0.25    Types: Cigarettes   Smokeless tobacco: Never  Vaping Use   Vaping Use: Never used  Substance Use Topics   Alcohol use: Yes    Alcohol/week: 3.0 standard drinks    Types: 3 Glasses  of wine per week   Drug use: Yes    Types: Marijuana     Review of Systems  Constitutional: No fever/chills Eyes: No visual changes. No discharge ENT: Positive for bilateral ear pain, sore throat Cardiovascular: no chest pain. Respiratory: Positive cough. No SOB. Gastrointestinal: No abdominal pain.  No nausea, no vomiting.  No diarrhea.  No constipation. Musculoskeletal: Negative for musculoskeletal pain. Skin: Negative for rash, abrasions, lacerations, ecchymosis. Neurological: Negative for headaches, focal weakness or numbness.  10 System ROS otherwise negative.  ____________________________________________   PHYSICAL EXAM:  VITAL  SIGNS: ED Triage Vitals  Enc Vitals Group     BP 07/05/21 1736 129/78     Pulse Rate 07/05/21 1736 91     Resp 07/05/21 1736 20     Temp 07/05/21 1735 98.3 F (36.8 C)     Temp Source 07/05/21 1735 Oral     SpO2 07/05/21 1736 97 %     Weight 07/05/21 1732 180 lb (81.6 kg)     Height 07/05/21 1732 5\' 4"  (1.626 m)     Head Circumference --      Peak Flow --      Pain Score 07/05/21 1732 9     Pain Loc --      Pain Edu? --      Excl. in GC? --      Constitutional: Alert and oriented. Well appearing and in no acute distress. Eyes: Conjunctivae are normal. PERRL. EOMI. Head: Atraumatic. ENT:      Ears:       Nose: No congestion/rhinnorhea.      Mouth/Throat: Mucous membranes are moist.  Oropharynx is slightly erythematous but there is no gross edema, exudates, uvula is midline.  No angioedema of the tongue.  Anterior neck and submandibular space is nonerythematous, nonedematous and nontender to palpation. Neck: No stridor.  No erythema or edema in the anterior neck.  No posterior tenderness to palpation Hematological/Lymphatic/Immunilogical: No cervical lymphadenopathy. Cardiovascular: Normal rate, regular rhythm. Normal S1 and S2.  Good peripheral circulation. Respiratory: Normal respiratory effort without tachypnea or retractions. Lungs CTAB. Good air entry to the bases with no decreased or absent breath sounds. Musculoskeletal: Full range of motion to all extremities. No gross deformities appreciated. Neurologic:  Normal speech and language. No gross focal neurologic deficits are appreciated.  Skin:  Skin is warm, dry and intact. No rash noted. Psychiatric: Mood and affect are normal. Speech and behavior are normal. Patient exhibits appropriate insight and judgement.   ____________________________________________   LABS (all labs ordered are listed, but only abnormal results are displayed)  Labs Reviewed  COMPREHENSIVE METABOLIC PANEL - Abnormal; Notable for the following  components:      Result Value   AST 14 (*)    All other components within normal limits  CBC WITH DIFFERENTIAL/PLATELET - Abnormal; Notable for the following components:   WBC 14.0 (*)    Neutro Abs 10.7 (*)    All other components within normal limits  RESP PANEL BY RT-PCR (FLU A&B, COVID) [id number]  GROUP A STREP BY PCR  MONONUCLEOSIS SCREEN   ____________________________________________  EKG   ____________________________________________  RADIOLOGY I personally viewed and evaluated these images as part of my medical decision making, as well as reviewing the written report by the radiologist.  ED Provider Interpretation: No acute cardiopulmonary findings on chest x-ray  DG Chest 2 View  Result Date: 07/05/2021 CLINICAL DATA:  Hemoptysis.  Sore throat.  Cough. EXAM: CHEST - 2 VIEW COMPARISON:  May 25, 2017 FINDINGS: The heart size and mediastinal contours are within normal limits. Both lungs are clear. The visualized skeletal structures are unremarkable. IMPRESSION: No active cardiopulmonary disease. Electronically Signed   By: Gerome Sam III M.D.   On: 07/05/2021 22:24    ____________________________________________    PROCEDURES  Procedure(s) performed:    Procedures    Medications  dexamethasone (DECADRON) injection 10 mg (has no administration in time range)  LORazepam (ATIVAN) tablet 1 mg (1 mg Oral Given 07/05/21 2148)  sodium chloride 0.9 % bolus 1,000 mL (1,000 mLs Intravenous New Bag/Given 07/05/21 2212)  iohexol (OMNIPAQUE) 300 MG/ML solution 75 mL (75 mLs Intravenous Contrast Given 07/05/21 2307)     ____________________________________________   INITIAL IMPRESSION / ASSESSMENT AND PLAN / ED COURSE  Pertinent labs & imaging results that were available during my care of the patient were reviewed by me and considered in my medical decision making (see chart for details).  Review of the [place] CSRS was performed in accordance of the [facility] prior to  dispensing any controlled drugs.           Patient's diagnosis is consistent with patient presents emergency department with sore throat, hemoptysis, bilateral ear pain.  Symptoms appear likely to be viral given the progression of symptoms but given the hemoptysis and the reported significant pain with swallowing patient will have labs, chest x-ray, COVID swab.  Patient will also have CT scan of her neck.  Differential includes viral URI, influenza, COVID, viral pharyngitis, tonsillar stones, peritonsillar abscess, pneumonia, bronchitis.  Prior to the return of results this section of the emergency department is closing and I will transfer care to the major side of the emergency department.  Patient history, physical exam and work-up to this time is discussed with attending provider, Dr. Lenard Lance..  Patient is stable at this time.  Final diagnosis and disposition will be provided by attending provider.         This chart was dictated using voice recognition software/Dragon. Despite best efforts to proofread, errors can occur which can change the meaning. Any change was purely unintentional.    Racheal Patches, PA-C 07/05/21 2316    Minna Antis, MD 07/07/21 [phone]

## 2021-07-05 NOTE — ED Triage Notes (Signed)
Patient c/o bilateral ear pain, congestion, sore throat, discomfort in chest and streaks of blood from throat that started 2 days ago

## 2021-07-06 MED ORDER — SODIUM CHLORIDE 0.9 % IV BOLUS (SEPSIS)
1000.0000 mL | Freq: Once | INTRAVENOUS | Status: AC
  Administered 2021-07-06: 1000 mL via INTRAVENOUS

## 2021-07-06 MED ORDER — AMOXICILLIN-POT CLAVULANATE 875-125 MG PO TABS: 1.0000 | ORAL_TABLET | Freq: Two times a day (BID) | ORAL | 0 refills | Status: AC

## 2021-07-06 NOTE — ED Provider Notes (Addendum)
11:30 PM  Assumed care at shift change.  Patient here with sore throat, bilateral ear pain.  Work-up pending.  12:00 AM  Pt's labs show leukocytosis of 14,000 with left shift.  COVID, flu, strep and mono negative.  CT of the soft tissues of the neck show acute tonsillopharyngitis without peritonsillar or retropharyngeal fluid collection.  She does have reactive cervical lymphadenopathy.  On my examination she also appears to have a right otitis media.  Still complaining of discomfort but has normal phonation, no stridor, trismus or drooling.  Nontoxic and well-appearing.  Will give Toradol, Decadron, Unasyn for symptomatic relief and p.o. challenge.  Anticipate discharge home on antibiotics.  12:35 AM  Pt tolerating p.o. and remains hemodynamically stable.  Will discharge home after IV complete.  1:45 AM  Pt reports feeling much better.  Discussed supportive care instructions and return precautions.  At this time, I do not feel there is any life-threatening condition present. I have reviewed, interpreted and discussed all results (EKG, imaging, lab, urine as appropriate) and exam findings with patient/family. I have reviewed nursing notes and appropriate previous records.  I feel the patient is safe to be discharged home without further emergent workup and can continue workup as an outpatient as needed. Discussed usual and customary return precautions. Patient/family verbalize understanding and are comfortable with this plan.  Outpatient follow-up has been provided as needed. All questions have been answered.    Tanayia Wahlquist, Layla Maw, DO 07/06/21 0037    Mekenzie Modeste, Layla Maw, DO 07/06/21 0539

## 2021-07-06 NOTE — Discharge Instructions (Addendum)
You may alternate Tylenol 1000 mg every 6 hours as needed for pain, fever and Ibuprofen 800 mg every 8 hours as needed for pain, fever.  Please take Ibuprofen with food.  Do not take more than 4000 mg of Tylenol (acetaminophen) in a 24 hour period.   Steps to find a Primary Care Provider (PCP):  Call [phone] or [phone] to access "[facility] Find a Doctor Service."  2.  You may also go on the [facility] website at [email or web address]  

## 2021-07-29 IMAGING — CT CT ABD-PELV W/O CM
2 of 4 series · 17 of 46 positions shown, 19 images · non-contrast
Comparison: 12/31/2019

CLINICAL DATA: Acute, non localized abdominal pain. Some associated
nausea and vomiting.

EXAM:
CT ABDOMEN AND PELVIS WITHOUT CONTRAST
TECHNIQUE: Multidetector CT imaging of the abdomen and pelvis was performed
following the standard protocol without IV contrast.

[Series 2: routine abd/pel wo · axial · 0.74mm/px · z∈[-527,-82]mm · 14 of 99 slices shown, 16 images]
[im 5/99  soft-tissue]
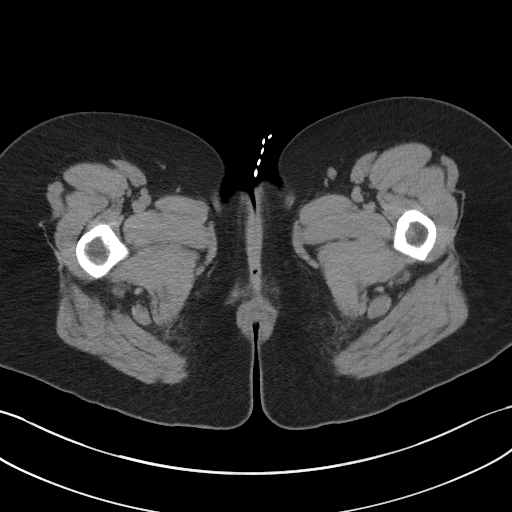
[im 5/99  bone]
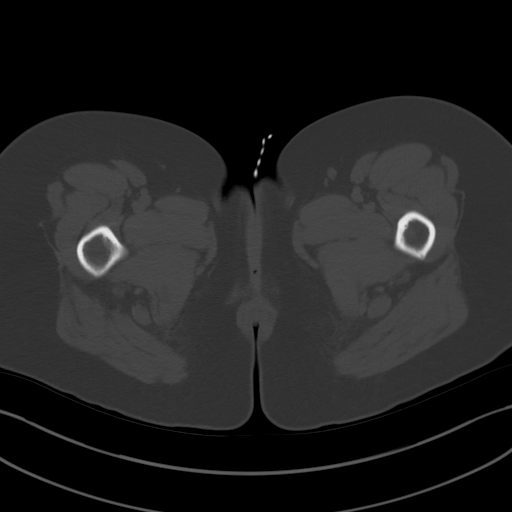
[im 14/99  soft-tissue]
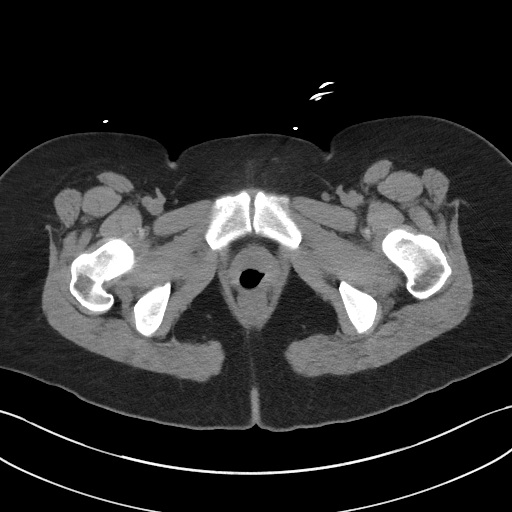
[im 18/99  soft-tissue]
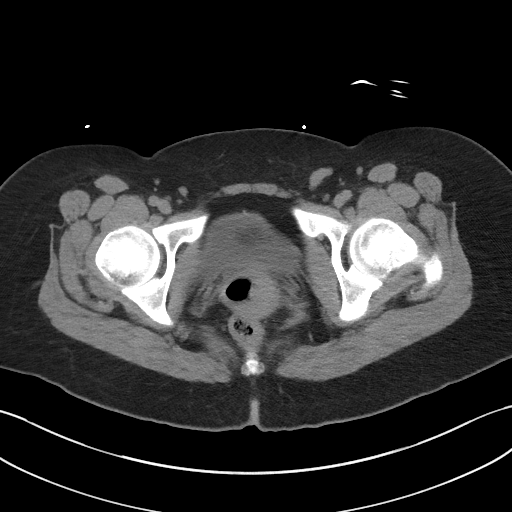
[im 27/99  soft-tissue]
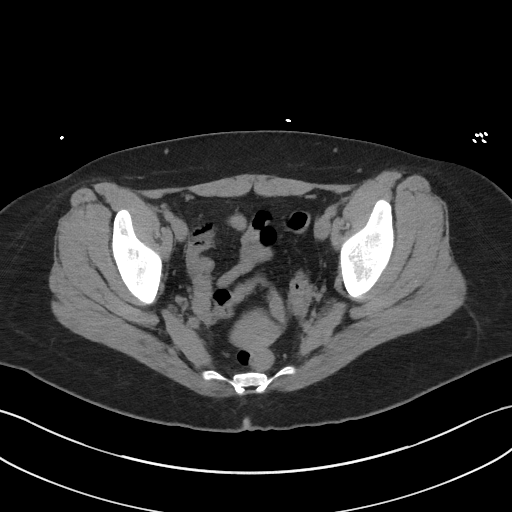
[im 32/99  soft-tissue]
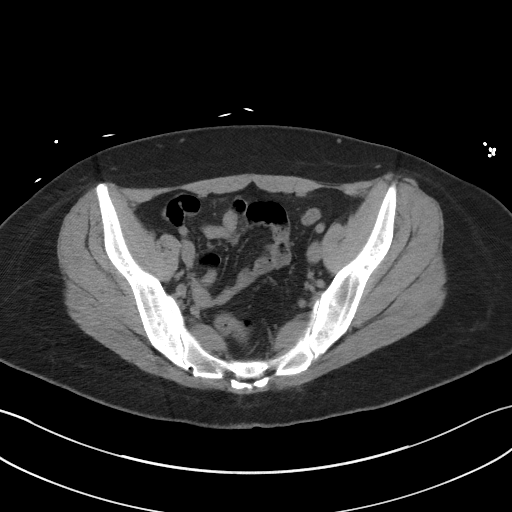
[im 41/99  soft-tissue]
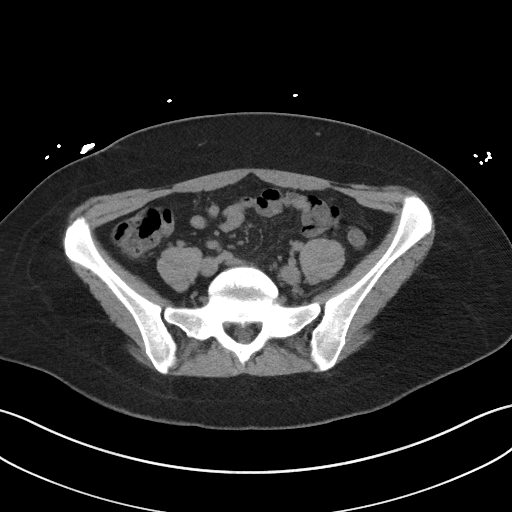
[im 45/99  soft-tissue]
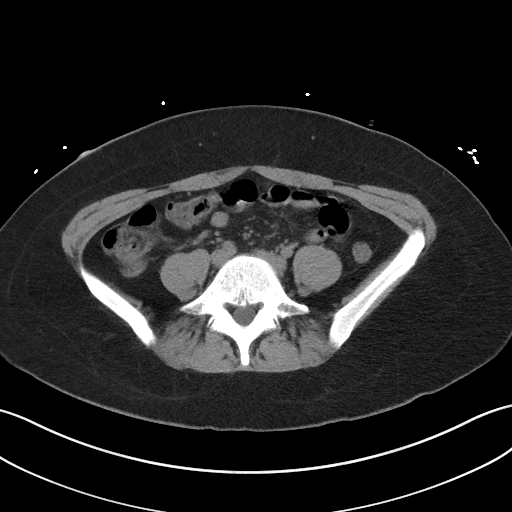
[im 54/99  soft-tissue]
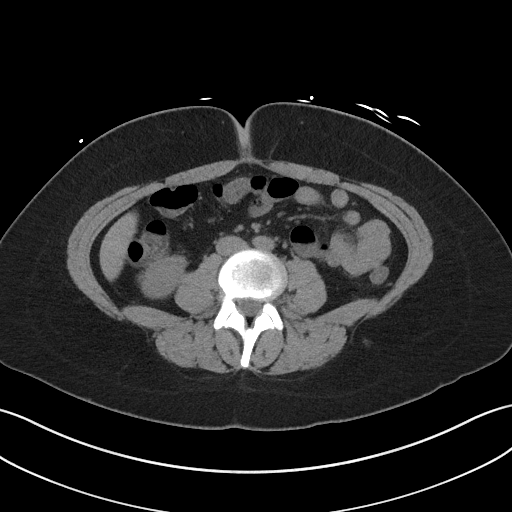
[im 58/99  soft-tissue]
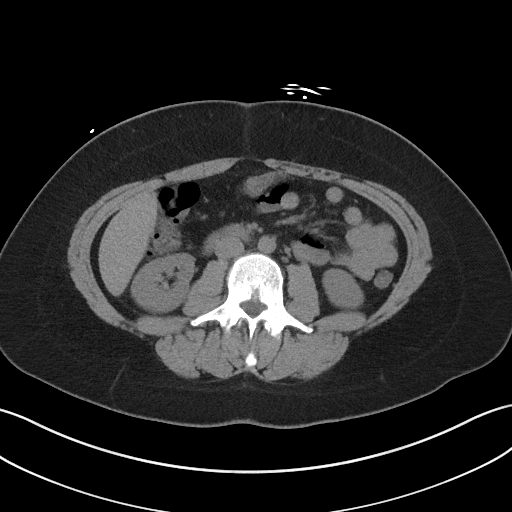
[im 58/99  bone]
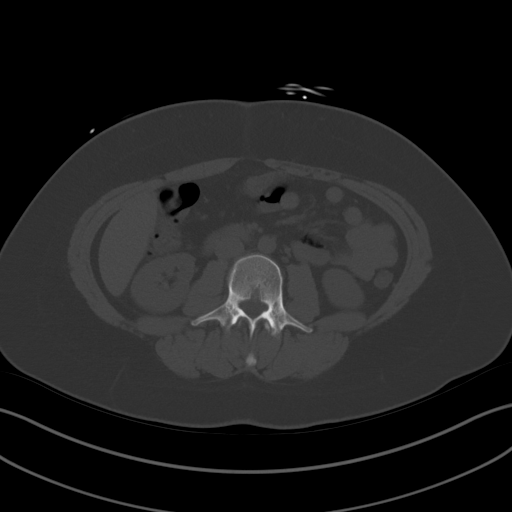
[im 67/99  soft-tissue]
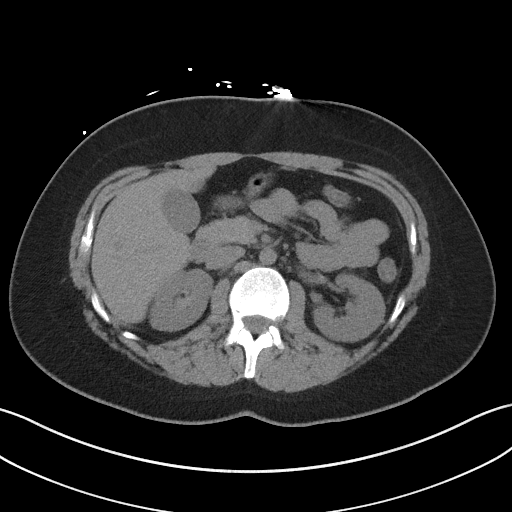
[im 72/99  soft-tissue]
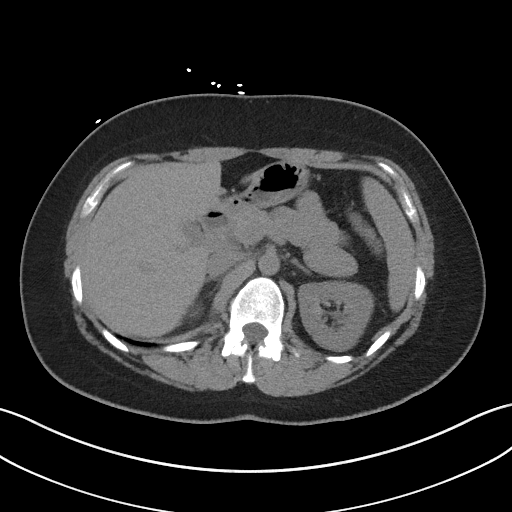
[im 81/99  soft-tissue]
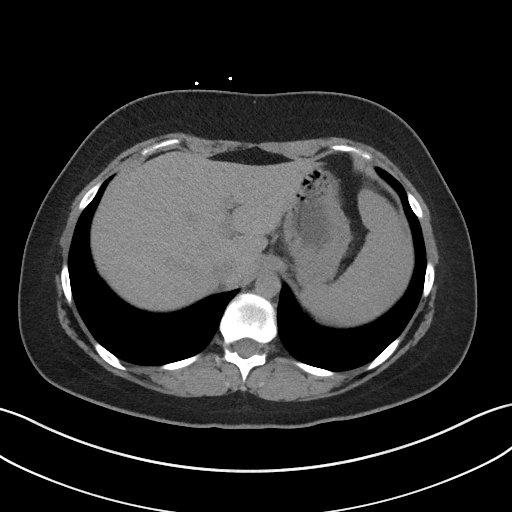
[im 85/99  soft-tissue]
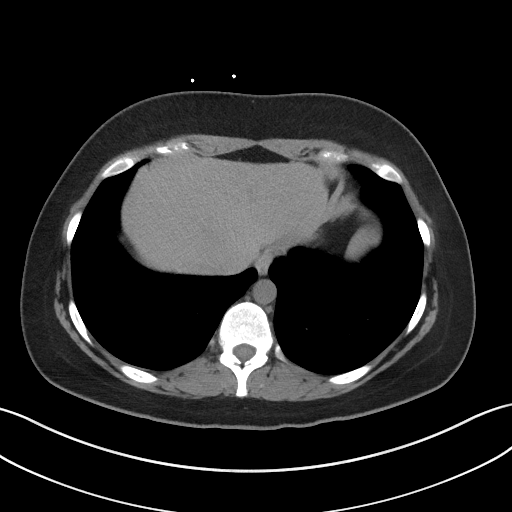
[im 94/99  soft-tissue]
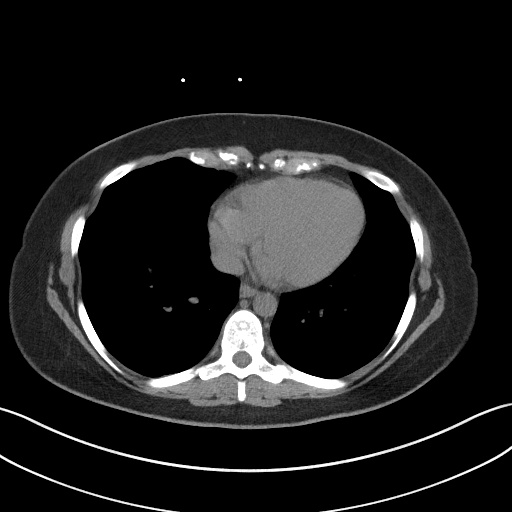

[Series 5: coronal st · coronal · 0.88mm/px · 3 of 84 slices shown]
[im 28/84  soft-tissue]
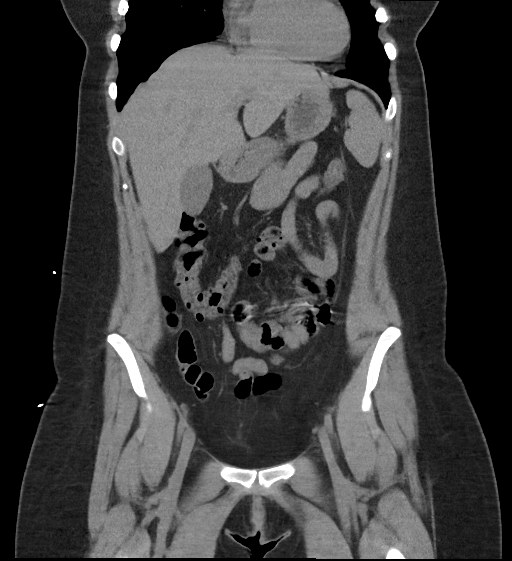
[im 37/84  soft-tissue]
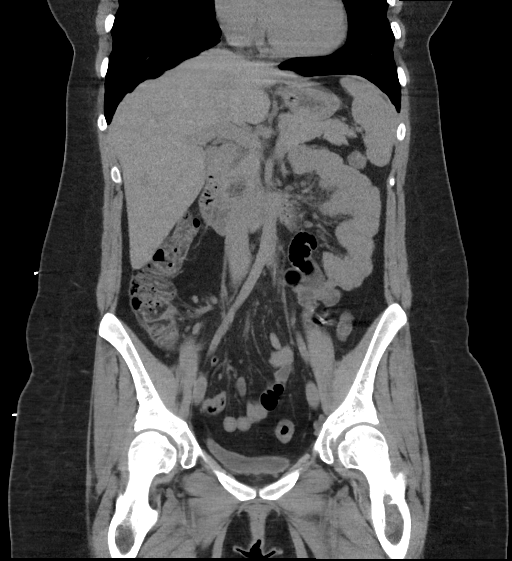
[im 47/84  soft-tissue]
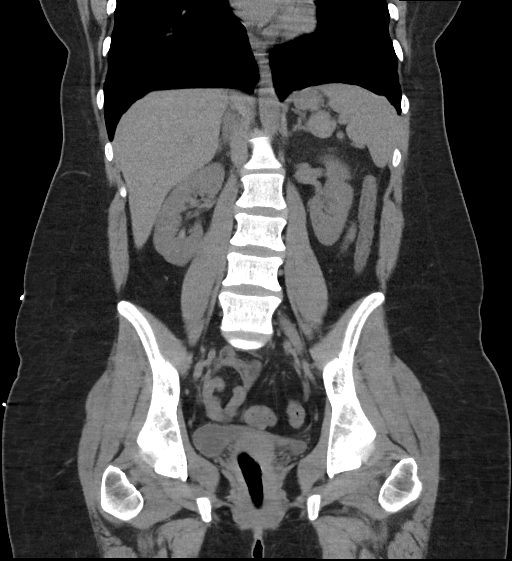

[17 of 46 positions shown; findings below may reference images not displayed]

FINDINGS: Lower chest: Unremarkable.

Hepatobiliary: No focal liver abnormality is seen. No gallstones,
gallbladder wall thickening, or biliary dilatation.

Pancreas: Unremarkable. No pancreatic ductal dilatation or
surrounding inflammatory changes.

Spleen: Normal in size without focal abnormality.

Adrenals/Urinary Tract: Stable tiny bilateral renal calculi. No
bladder or ureteral calculi and no hydronephrosis. Normal appearing
adrenal glands.

Stomach/Bowel: Stomach is within normal limits. Appendix appears
normal. No evidence of bowel wall thickening, distention, or
inflammatory changes.

Vascular/Lymphatic: No significant vascular findings are present. No
enlarged abdominal or pelvic lymph nodes.

Reproductive: Tampon in the vagina. Unremarkable uterus. No adnexal
abnormalities.

Other: None.

Musculoskeletal: Stable mild scoliosis.
IMPRESSION: 1. No acute abnormality.
2. Stable tiny bilateral renal calculi.

## 2022-04-08 ENCOUNTER — Other Ambulatory Visit: Payer: Self-pay

## 2022-04-08 ENCOUNTER — Emergency Department
Admission: EM | Admit: 2022-04-08 | Discharge: 2022-04-08 | Disposition: A | Discharge status: Home / Self Care | Payer: Medicaid Other | Attending: Emergency Medicine | Admitting: Emergency Medicine

## 2022-04-08 ENCOUNTER — Emergency Department: Payer: Medicaid Other

## 2022-04-08 ENCOUNTER — Encounter: Payer: Self-pay | Admitting: Emergency Medicine

## 2022-04-08 DIAGNOSIS — N939 Abnormal uterine and vaginal bleeding, unspecified: Secondary | ICD-10-CM | POA: Insufficient documentation

## 2022-04-08 DIAGNOSIS — R102 Pelvic and perineal pain: Secondary | ICD-10-CM

## 2022-04-08 LAB — COMPREHENSIVE METABOLIC PANEL
ALT: 19 U/L (ref 0–44)
AST: 20 U/L (ref 15–41)
Albumin: 4 g/dL (ref 3.5–5.0)
Alkaline Phosphatase: 74 U/L (ref 38–126)
Anion gap: 7 (ref 5–15)
BUN: 11 mg/dL (ref 6–20)
CO2: 22 mmol/L (ref 22–32)
Calcium: 8.9 mg/dL (ref 8.9–10.3)
Chloride: 111 mmol/L (ref 98–111)
Creatinine, Ser: 0.57 mg/dL (ref 0.44–1.00)
GFR, Estimated: >60 mL/min (ref >60)
Glucose, Bld: 100 mg/dL — ABNORMAL HIGH (ref 70–99)
Potassium: 3.9 mmol/L (ref 3.5–5.1)
Sodium: 140 mmol/L (ref 135–145)
Total Bilirubin: 0.5 mg/dL (ref 0.3–1.2)
Total Protein: 6.8 g/dL (ref 6.5–8.1)

## 2022-04-08 LAB — URINALYSIS, ROUTINE W REFLEX MICROSCOPIC
Bilirubin Urine: NEGATIVE
Glucose, UA: NEGATIVE mg/dL
Ketones, ur: NEGATIVE mg/dL
Leukocytes,Ua: NEGATIVE
Nitrite: NEGATIVE
Protein, ur: NEGATIVE mg/dL
Specific Gravity, Urine: 1.023 (ref 1.005–1.030)
pH: 5 (ref 5.0–8.0)

## 2022-04-08 LAB — CBC
HCT: 43.9 % (ref 36.0–46.0)
Hemoglobin: 15.1 g/dL — ABNORMAL HIGH (ref 12.0–15.0)
MCH: 31.9 pg (ref 26.0–34.0)
MCHC: 34.4 g/dL (ref 30.0–36.0)
MCV: 92.8 fL (ref 80.0–100.0)
Platelets: 224 10*3/uL (ref 150–400)
RBC: 4.73 MIL/uL (ref 3.87–5.11)
RDW: 11.9 % (ref 11.5–15.5)
WBC: 9.6 10*3/uL (ref 4.0–10.5)
nRBC: 0 % (ref 0.0–0.2)

## 2022-04-08 LAB — LIPASE, BLOOD: Lipase: 29 U/L (ref 11–51)

## 2022-04-08 LAB — POC URINE PREG, ED: Preg Test, Ur: NEGATIVE

## 2022-04-08 MED ORDER — FENTANYL CITRATE PF 50 MCG/ML IJ SOSY
50.0000 ug | PREFILLED_SYRINGE | Freq: Once | INTRAMUSCULAR | Status: AC
  Administered 2022-04-08: 50 ug via INTRAMUSCULAR
  Filled 2022-04-08: qty 1

## 2022-04-08 NOTE — ED Provider Notes (Signed)
[facility] Provider Note  Patient Contact: 5:44 PM (approximate)   History   Abdominal Pain and Vaginal Bleeding   HPI  Alexandra Koch is a 26 y.o. female with a history of ovarian cyst, presents to the emergency department with midline lower abdominal discomfort that started last night.  Patient also states that she has had some bright red vaginal bleeding.  She states that she did have 2 at-home pregnancy test they were positive and became concerned.  She does have a Nexplanon in place and it was placed in March of this year.  She states that her last ovarian cyst ruptured about a year ago.  She denies fever and chills.  No chest pain, chest tightness or shortness of breath.  No vomiting.      Physical Exam   Triage Vital Signs: ED Triage Vitals  Enc Vitals Group     BP 04/08/22 1556 (!) 137/102     Pulse Rate 04/08/22 1556 85     Resp 04/08/22 1556 18     Temp 04/08/22 1556 98.5 F (36.9 C)     Temp Source 04/08/22 1556 Oral     SpO2 04/08/22 1556 98 %     Weight 04/08/22 1554 185 lb (83.9 kg)     Height 04/08/22 1554 5' 4.5" (1.638 m)     Head Circumference --      Peak Flow --      Pain Score --      Pain Loc --      Pain Edu? --      Excl. in GC? --     Most recent vital signs: Vitals:   04/08/22 1842 04/08/22 1940  BP: (!) 130/94   Pulse: 80   Resp: 18   Temp:  98.1 F (36.7 C)  SpO2: 98%      General: Alert and in no acute distress. Eyes:  PERRL. EOMI. Head: No acute traumatic findings ENT:      Nose: No congestion/rhinnorhea.      Mouth/Throat: Mucous membranes are moist.  Neck: No stridor. No cervical spine tenderness to palpation. Cardiovascular:  Good peripheral perfusion Respiratory: Normal respiratory effort without tachypnea or retractions. Lungs CTAB. Good air entry to the bases with no decreased or absent breath sounds. Gastrointestinal: Bowel sounds 4 quadrants. Soft and nontender to palpation. No guarding or  rigidity. No palpable masses. No distention. No CVA tenderness. Musculoskeletal: Full range of motion to all extremities.  Neurologic:  No gross focal neurologic deficits are appreciated.  Skin:   No rash noted Other:   ED Results / Procedures / Treatments   Labs (all labs ordered are listed, but only abnormal results are displayed) Labs Reviewed  COMPREHENSIVE METABOLIC PANEL - Abnormal; Notable for the following components:      Result Value   Glucose, Bld 100 (*)    All other components within normal limits  CBC - Abnormal; Notable for the following components:   Hemoglobin 15.1 (*)    All other components within normal limits  URINALYSIS, ROUTINE W REFLEX MICROSCOPIC - Abnormal; Notable for the following components:   Color, Urine YELLOW (*)    APPearance CLEAR (*)    Hgb urine dipstick LARGE (*)    Bacteria, UA RARE (*)    All other components within normal limits  LIPASE, BLOOD  POC URINE PREG, ED       RADIOLOGY  I personally viewed and evaluated these images as part of my medical  decision making, as well as reviewing the written report by the radiologist.  ED Provider Interpretation: Normal pelvic ultrasound.   PROCEDURES:  Critical Care performed: No  Procedures   MEDICATIONS ORDERED IN ED: Medications  fentaNYL (SUBLIMAZE) injection 50 mcg (50 mcg Intramuscular Given 04/08/22 1821)     IMPRESSION / MDM / ASSESSMENT AND PLAN / ED COURSE  I reviewed the triage vital signs and the nursing notes.                              Assessment and plan:  Pelvic pain:  25 year old female presents to the emergency department with pelvic pain since last night.  Vital signs were reassuring at triage.  On exam, patient was alert, active and nontoxic-appearing.   CBC, CMP and lipase reassuring.  Urinalysis indicates large amount of hematuria consistent with patient's chief complaint.  Urine pregnancy negative.  Dedicated transvaginal ultrasound shows no signs of  ovarian cyst or other acute abnormalities.  Recommended OB/GYN follow-up.  Return precautions were given to return with new or worsening symptoms.   FINAL CLINICAL IMPRESSION(S) / ED DIAGNOSES   Final diagnoses:  Pelvic pain     Rx / DC Orders   ED Discharge Orders     None        Note:  This document was prepared using Dragon voice recognition software and may include unintentional dictation errors.   Pia Mau [place], Cordelia Poche 04/08/22 1945    Chesley Noon, MD 04/08/22 2223

## 2022-04-08 NOTE — ED Notes (Signed)
See triage note  Presents with some vaginal bleeding and some abd discomfort  She is concerned about pregnancy

## 2022-04-08 NOTE — ED Triage Notes (Signed)
Pt via POV from home. Pt states that she having abd pain and vaginal bleeding. Started last night. States it was bright at first and then turned dark. Pt has hx of ovarian cyst. States she does not normally have a period, pt has the nexaplanon. Pt states she took a pregnancy test and it was positive approx 3 days ago. Pt is A&OX4 and NAD.

## 2022-08-24 ENCOUNTER — Emergency Department: Payer: Medicaid Other

## 2022-08-24 ENCOUNTER — Other Ambulatory Visit: Payer: Self-pay

## 2022-08-24 ENCOUNTER — Emergency Department
Admission: EM | Admit: 2022-08-24 | Discharge: 2022-08-25 | Disposition: A | Discharge status: Home / Self Care | Payer: Medicaid Other | Attending: Emergency Medicine | Admitting: Emergency Medicine

## 2022-08-24 DIAGNOSIS — N644 Mastodynia: Secondary | ICD-10-CM | POA: Diagnosis present

## 2022-08-24 DIAGNOSIS — N6011 Diffuse cystic mastopathy of right breast: Secondary | ICD-10-CM

## 2022-08-24 DIAGNOSIS — N6012 Diffuse cystic mastopathy of left breast: Secondary | ICD-10-CM | POA: Diagnosis not present

## 2022-08-24 DIAGNOSIS — J45909 Unspecified asthma, uncomplicated: Secondary | ICD-10-CM | POA: Insufficient documentation

## 2022-08-24 MED ORDER — HYDROCODONE-ACETAMINOPHEN 5-325 MG PO TABS
1.0000 | ORAL_TABLET | Freq: Once | ORAL | Status: AC
  Administered 2022-08-24: 1 via ORAL
  Filled 2022-08-24: qty 1

## 2022-08-24 MED ORDER — ONDANSETRON 4 MG PO TBDP
4.0000 mg | ORAL_TABLET | Freq: Once | ORAL | Status: AC
Start: 2022-08-24 — End: 2022-08-24
  Administered 2022-08-24: 4 mg via ORAL
  Filled 2022-08-24: qty 1

## 2022-08-24 NOTE — ED Provider Notes (Signed)
[facility] Emergency Department Provider Note     Event Date/Time   First MD Initiated Contact with Patient 08/24/22 2239     (approximate)   History   Breast Pain   HPI  Alexandra Koch is a 27 y.o. female with a history of OCD, scoliosis, asthma, ovarian cyst, and anxiety, presents to the ED for evaluation of right breast pain.  Patient reports onset of symptoms on Thursday night.  She notes exquisite tenderness to the inferior portion of her right breast, noting that even light touch is painful.  She gives a history of a breast cyst for the last year, but denies any interim complaints.  She denies any breast redness, swelling, nipple discharge, or skin changes.  She also denies any fevers, chills, or sweats.  Physical Exam   Triage Vital Signs: ED Triage Vitals [08/24/22 2219]  Enc Vitals Group     BP 113/69     Pulse Rate 85     Resp 18     Temp 98 F (36.7 C)     Temp Source Oral     SpO2 96 %     Weight 190 lb (86.2 kg)     Height 5\' 4"  (1.626 m)     Head Circumference      Peak Flow      Pain Score 7     Pain Loc      Pain Edu?      Excl. in GC?     Most recent vital signs: Vitals:   08/24/22 2219  BP: 113/69  Pulse: 85  Resp: 18  Temp: 98 F (36.7 C)  SpO2: 96%    General Awake, no distress.  CV:  Good peripheral perfusion.  RESP:  Normal effort.  ABD:  No distention.  SKIN:  Right breast without any obvious deformity, erythema, induration, warmth, or peau d'orange skin changes.  Patient exquisitely tender to light touch over the inferior medial lateral borders of the breast, in the 5 to 8 o'clock position.  Fibrocystic breast changes noted in the area of concern.  No nipple discharge is noted.  No palpable lymph nodes to the axilla or clavicular region.   ED Results / Procedures / Treatments   Labs (all labs ordered are listed, but only abnormal results are displayed) Labs Reviewed - No data to  display   EKG    RADIOLOGY  I personally viewed and evaluated these images as part of my medical decision making, as well as reviewing the written report by the radiologist.  ED Provider Interpretation: no acute abscess or mass  2220 Breast Right Limited  IMPRESSION: No sonographic abnormality in the area of clinical concern in the right breast.  PROCEDURES:  Critical Care performed: No  Procedures   MEDICATIONS ORDERED IN ED: Medications  HYDROcodone-acetaminophen (NORCO/VICODIN) 5-325 MG per tablet 1 tablet (1 tablet Oral Given 08/24/22 2320)  ondansetron (ZOFRAN-ODT) disintegrating tablet 4 mg (4 mg Oral Given 08/24/22 2320)     IMPRESSION / MDM / ASSESSMENT AND PLAN / ED COURSE  I reviewed the triage vital signs and the nursing notes.                              Differential diagnosis includes, but is not limited to, breast abscess, mastitis, breast malignancy, fibrocystic breast disease, Paget's disease  Patient's presentation is most consistent with acute complicated illness / injury requiring diagnostic  workup.  Patient to the ED for acute right breast tenderness on palpation.  Patient presents with concern for possible mass given family history.  She is evaluated for complaints in the ED, found to have reassuring exam and ultrasound.  Patient with exquisite tenderness to the breast and underlying fibrocystic changes noted.  No abnormal findings on breast ultrasound at this time.  Patient's diagnosis is consistent with acute breast pain and fibrocystic disease. Patient will be discharged home with prescriptions for:. Patient is to follow up with [facility] breast center as needed or otherwise directed. Patient is given ED precautions to return to the ED for any worsening or new symptoms.   FINAL CLINICAL IMPRESSION(S) / ED DIAGNOSES   Final diagnoses:  Breast pain, right  Fibrocystic breast changes of both breasts     Rx / DC Orders   ED Discharge Orders           Ordered    HYDROcodone-acetaminophen (NORCO) 5-325 MG tablet  3 times daily PRN        08/25/22 0036             Note:  This document was prepared using Dragon voice recognition software and may include unintentional dictation errors.    Melvenia Needles, PA-C 08/26/22 1926    Lavonia Drafts, MD 08/27/22 1051

## 2022-08-24 NOTE — ED Triage Notes (Signed)
Pt presents to ER with c/o right breast pain that started Thursday night.  Pt states she thinks she has had a cyst on her breast for last year, but has not bothered her until now.  No redness/swelling to right breast per pt.  Pt states that it hurts in her breast when she moves her arms in certain ways.  Pt is otherwise A&O x4 at this time in NAD.

## 2022-08-25 MED ORDER — HYDROCODONE-ACETAMINOPHEN 5-325 MG PO TABS: 1.0000 | ORAL_TABLET | Freq: Three times a day (TID) | ORAL | 0 refills | Status: AC | PRN

## 2022-08-25 NOTE — Discharge Instructions (Signed)
Please go to the following website to schedule new (and existing) patient appointments:   [email or web address]   The following is a list of primary care offices in the area who are accepting new patients at this time.  Please reach out to one of them directly and let them know you would like to schedule an appointment to follow up on an Emergency Department visit, and/or to establish a new primary care provider (PCP).  There are likely other primary care clinics in the are who are accepting new patients, but this is an excellent place to start:  [facility] Lead physician: Dr Angela Bacigalupo [address] [place], [place] [address] [phone]  [facility] Lead Physician: Dr Krichna Sowles [address], [place], [place] [address] [phone]  [facility]  Lead Physician: Dr Megan Johnson [address], [place], [place] [address] [phone]  [facility] [place] [facility] Lead Physician: Dr Alex Karamalegos [address], [place], [place] [address] [phone]  [facility] [place] Lead Physician: Dr Laura Berglund [address], [place], [place] [address] [phone]   

## 2022-12-08 ENCOUNTER — Emergency Department
Admission: EM | Admit: 2022-12-08 | Discharge: 2022-12-08 | Disposition: A | Discharge status: Home / Self Care | Payer: Medicaid Other | Attending: Emergency Medicine | Admitting: Emergency Medicine

## 2022-12-08 ENCOUNTER — Encounter: Payer: Self-pay | Admitting: Emergency Medicine

## 2022-12-08 ENCOUNTER — Other Ambulatory Visit: Payer: Self-pay

## 2022-12-08 DIAGNOSIS — H6500 Acute serous otitis media, unspecified ear: Secondary | ICD-10-CM

## 2022-12-08 DIAGNOSIS — J45909 Unspecified asthma, uncomplicated: Secondary | ICD-10-CM | POA: Diagnosis not present

## 2022-12-08 DIAGNOSIS — H6502 Acute serous otitis media, left ear: Secondary | ICD-10-CM | POA: Diagnosis not present

## 2022-12-08 DIAGNOSIS — H7292 Unspecified perforation of tympanic membrane, left ear: Secondary | ICD-10-CM | POA: Insufficient documentation

## 2022-12-08 DIAGNOSIS — H9202 Otalgia, left ear: Secondary | ICD-10-CM | POA: Diagnosis present

## 2022-12-08 MED ORDER — HYDROCODONE-ACETAMINOPHEN 5-325 MG PO TABS
1.0000 | ORAL_TABLET | Freq: Once | ORAL | Status: AC
  Administered 2022-12-08: 1 via ORAL
  Filled 2022-12-08: qty 1

## 2022-12-08 MED ORDER — AMOXICILLIN-POT CLAVULANATE 875-125 MG PO TABS: 1.0000 | ORAL_TABLET | Freq: Two times a day (BID) | ORAL | 0 refills | Status: AC

## 2022-12-08 MED ORDER — OFLOXACIN 0.3 % OT SOLN
5.0000 [drp] | Freq: Every day | OTIC | 0 refills | Status: AC
Start: 2022-12-08 — End: ?

## 2022-12-08 MED ORDER — HYDROCODONE-ACETAMINOPHEN 5-325 MG PO TABS: 1.0000 | ORAL_TABLET | Freq: Four times a day (QID) | ORAL | 0 refills | Status: AC | PRN

## 2022-12-08 NOTE — ED Triage Notes (Signed)
Patient to ED for left ear pain since yesterday. Patient states she had some bleeding from ear today. Had cold symptoms yesterday morning. No currently bleeding noted at this time.

## 2022-12-08 NOTE — Discharge Instructions (Signed)
Use medications as prescribed Take ibuprofen and the Vicodin for pain as needed.  If the Vicodin makes you drowsy you can take Tylenol.  However for the next 2 days she may actually need the narcotic due to the pain. Return the emergency department if you are worsening Follow-up with your regular doctor if not improving in 3 days [facility] if not improving within 1 week

## 2022-12-08 NOTE — ED Provider Notes (Signed)
[facility] Provider Note    Event Date/Time   First MD Initiated Contact with Patient 12/08/22 1041     (approximate)   History   Otalgia   HPI  Alexandra Koch is a 27 y.o. female with history of asthma, depression presents emergency department complaining of bleeding from the left ear.  Symptoms started this morning.  States she had a bad earache last night and woke up with blood on her pillow this morning.  Still having a lot of pain to the ear.  No known injury.  No fever or chills although she states she felt warm last night.  Patient took Tylenol for pain without any relief      Physical Exam   Triage Vital Signs: ED Triage Vitals  Enc Vitals Group     BP 12/08/22 1021 124/81     Pulse Rate 12/08/22 1021 (!) 102     Resp 12/08/22 1021 18     Temp 12/08/22 1021 98.6 F (37 C)     Temp Source 12/08/22 1021 Oral     SpO2 12/08/22 1021 100 %     Weight --      Height --      Head Circumference --      Peak Flow --      Pain Score 12/08/22 1020 8     Pain Loc --      Pain Edu? --      Excl. in GC? --     Most recent vital signs: Vitals:   12/08/22 1021  BP: 124/81  Pulse: (!) 102  Resp: 18  Temp: 98.6 F (37 C)  SpO2: 100%     General: Awake, no distress.   CV:  Good peripheral perfusion. regular rate and  rhythm Resp:  Normal effort. Lungs cta Abd:  No distention.   Other:  Left TM with blood noted in the canal, TM is bloody, swollen, pain is reproduced with examination, right TM appears to be normal, neck is supple, no lymphadenopathy   ED Results / Procedures / Treatments   Labs (all labs ordered are listed, but only abnormal results are displayed) Labs Reviewed - No data to display   EKG     RADIOLOGY     PROCEDURES:   Procedures   MEDICATIONS ORDERED IN ED: Medications  HYDROcodone-acetaminophen (NORCO/VICODIN) 5-325 MG per tablet 1 tablet (has no administration in time range)     IMPRESSION / MDM  / ASSESSMENT AND PLAN / ED COURSE  I reviewed the triage vital signs and the nursing notes.                              Differential diagnosis includes, but is not limited to, otitis media, tympanic membrane rupture, otitis externa  Patient's presentation is most consistent with acute, uncomplicated illness.   Physical exam is consistent with otitis media with TM rupture, will place the patient on Augmentin, Floxin otic drops, Vicodin for pain.  She is to follow-up with [facility] if not improving within 1 week.  Follow-up with your regular doctor as needed.  Return emergency department worsening.  Patient is in agreement treatment plan.  She was discharged stable condition.      FINAL CLINICAL IMPRESSION(S) / ED DIAGNOSES   Final diagnoses:  Acute serous otitis media, recurrence not specified, unspecified laterality  Tympanic membrane perforation, left     Rx / DC Orders  ED Discharge Orders          Ordered    amoxicillin-clavulanate (AUGMENTIN) 875-125 MG tablet  2 times daily        12/08/22 1056    ofloxacin (FLOXIN) 0.3 % OTIC solution  Daily        12/08/22 1056    HYDROcodone-acetaminophen (NORCO/VICODIN) 5-325 MG tablet  Every 6 hours PRN        12/08/22 1056             Note:  This document was prepared using Dragon voice recognition software and may include unintentional dictation errors.    Faythe Ghee, PA-C 12/08/22 1213    Jene Every, MD 12/08/22 1225

## 2023-02-08 ENCOUNTER — Other Ambulatory Visit: Payer: Self-pay

## 2023-02-08 DIAGNOSIS — L03116 Cellulitis of left lower limb: Secondary | ICD-10-CM | POA: Diagnosis not present

## 2023-02-08 DIAGNOSIS — L02416 Cutaneous abscess of left lower limb: Secondary | ICD-10-CM | POA: Diagnosis present

## 2023-02-08 NOTE — ED Triage Notes (Signed)
Pt to ed from home via POV for an abscess on her left inner thigh. She has an apt with her PCP clinic to see them about it. Pt advised it started Friday and got worse. Pt is CAOx4, in no acute distress and ambulatory in triage.

## 2023-02-09 ENCOUNTER — Emergency Department
Admission: EM | Admit: 2023-02-09 | Discharge: 2023-02-09 | Disposition: A | Discharge status: Home / Self Care | Payer: Medicaid Other | Attending: Emergency Medicine | Admitting: Emergency Medicine

## 2023-02-09 DIAGNOSIS — L02416 Cutaneous abscess of left lower limb: Secondary | ICD-10-CM

## 2023-02-09 MED ORDER — SULFAMETHOXAZOLE-TRIMETHOPRIM 800-160 MG PO TABS
1.0000 | ORAL_TABLET | Freq: Once | ORAL | Status: AC
Start: 2023-02-09 — End: 2023-02-09
  Administered 2023-02-09: 1 via ORAL
  Filled 2023-02-09: qty 1

## 2023-02-09 MED ORDER — LIDOCAINE HCL (PF) 1 % IJ SOLN
20.0000 mL | Freq: Once | INTRAMUSCULAR | Status: AC
  Administered 2023-02-09: 20 mL
  Filled 2023-02-09: qty 20

## 2023-02-09 MED ORDER — KETOROLAC TROMETHAMINE 15 MG/ML IJ SOLN
15.0000 mg | Freq: Once | INTRAMUSCULAR | Status: AC
Start: 2023-02-09 — End: 2023-02-09
  Administered 2023-02-09: 15 mg via INTRAVENOUS
  Filled 2023-02-09: qty 1

## 2023-02-09 MED ORDER — ACETAMINOPHEN 325 MG PO TABS
650.0000 mg | ORAL_TABLET | Freq: Once | ORAL | Status: AC
Start: 2023-02-09 — End: 2023-02-09
  Administered 2023-02-09: 650 mg via ORAL
  Filled 2023-02-09: qty 2

## 2023-02-09 MED ORDER — SULFAMETHOXAZOLE-TRIMETHOPRIM 800-160 MG PO TABS: 1.0000 | ORAL_TABLET | Freq: Two times a day (BID) | ORAL | 0 refills | Status: AC

## 2023-02-09 NOTE — Discharge Instructions (Addendum)
Take acetaminophen 650 mg and ibuprofen 400 mg every 6 hours for pain.  Take with food. Take antibiotics as prescribed.   Please keep your wound clean by washing at least daily with soap and water. If you see any signs of worsening infection like spreading redness, extreme pain, fevers, chills or any other worsening doctor right away or come back to the emergency department.  See your doctor this week for a follow-up appointment to check on the wound healing.

## 2023-02-09 NOTE — ED Provider Notes (Signed)
[facility] Provider Note    Event Date/Time   First MD Initiated Contact with Patient 02/09/23 0104     (approximate)   History   Abscess (Inner thigh)   HPI  Alexandra Koch is a 27 y.o. female   Past medical history of no significant past medical history presents the emergency department with abscess to the inner left thigh.  Started as a small pimple ingrown hair and progressed with redness surrounding and significant pain.  No systemic symptoms of infection like fevers or chills.  No other acute medical complaints.   External Medical Documents Reviewed: [facility] visit in April 2024 she was diagnosed with ear infection and started on Augmentin      Physical Exam   Triage Vital Signs: ED Triage Vitals  Enc Vitals Group     BP 02/08/23 2319 (!) 120/90     Pulse Rate 02/08/23 2319 100     Resp 02/08/23 2319 16     Temp 02/08/23 2319 98 F (36.7 C)     Temp Source 02/08/23 2319 Oral     SpO2 02/08/23 2319 95 %     Weight 02/08/23 2317 200 lb (90.7 kg)     Height 02/08/23 2317 5\' 4"  (1.626 m)     Head Circumference --      Peak Flow --      Pain Score 02/08/23 2317 10     Pain Loc --      Pain Edu? --      Excl. in GC? --     Most recent vital signs: Vitals:   02/08/23 2319  BP: (!) 120/90  Pulse: 100  Resp: 16  Temp: 98 F (36.7 C)  SpO2: 95%    General: Awake, no distress.  CV:  Good peripheral perfusion.  Resp:  Normal effort.  Abd:  No distention.  Other:  There is an abscess fluctuance 1 cm x 1 cm in the left inner thigh with surrounding cellulitic changes approximately 3 cm radius.  No crepitus or pain out of proportion.  No fever.   ED Results / Procedures / Treatments   Labs (all labs ordered are listed, but only abnormal results are displayed) Labs Reviewed - No data to display    PROCEDURES:  Critical Care performed: No  ..Incision and Drainage  Date/Time: 02/09/2023 2:38 AM  Performed by:  Pilar Jarvis, MD Authorized by: Pilar Jarvis, MD   Consent:    Consent obtained:  Verbal   Consent given by:  Patient   Risks discussed:  Bleeding, incomplete drainage, infection and damage to other organs   Alternatives discussed:  No treatment, delayed treatment and alternative treatment Universal protocol:    Procedure explained and questions answered to patient or proxy's satisfaction: yes     Patient identity confirmed:  Verbally with patient Location:    Type:  Abscess   Size:  2   Location:  Lower extremity   Lower extremity location:  Leg   Leg location:  L upper leg Pre-procedure details:    Skin preparation:  Chlorhexidine with alcohol Sedation:    Sedation type:  None Anesthesia:    Anesthesia method:  Local infiltration   Local anesthetic:  Lidocaine 1% w/o epi Procedure type:    Complexity:  Complex Procedure details:    Ultrasound guidance: no     Needle aspiration: no     Incision types:  Stab incision   Incision depth:  Dermal   Wound  management:  Probed and deloculated   Drainage:  Purulent   Drainage amount:  Moderate   Wound treatment:  Wound left open   Packing materials:  None Post-procedure details:    Procedure completion:  Tolerated    MEDICATIONS ORDERED IN ED: Medications  lidocaine (PF) (XYLOCAINE) 1 % injection 20 mL (has no administration in time range)  sulfamethoxazole-trimethoprim (BACTRIM DS) 800-160 MG per tablet 1 tablet (1 tablet Oral Given 02/09/23 0119)  ketorolac (TORADOL) 15 MG/ML injection 15 mg (15 mg Intravenous Given 02/09/23 0119)  acetaminophen (TYLENOL) tablet 650 mg (650 mg Oral Given 02/09/23 0118)     IMPRESSION / MDM / ASSESSMENT AND PLAN / ED COURSE  I reviewed the triage vital signs and the nursing notes.                                Patient's presentation is most consistent with acute presentation with potential threat to life or bodily function.  Differential diagnosis includes, but is not limited to,  folliculitis, cellulitis, abscess   The patient is on the cardiac monitor to evaluate for evidence of arrhythmia and/or significant heart rate changes.  MDM: This is a patient with an abscess likely from folliculitis ingrown hair with surrounding cellulitic changes and no systemic symptoms.  Does not appear toxic, doubt sepsis.  I&D performed as above, antibiotics given, she will follow-up with PMD and return with any new or worsening symptoms.         FINAL CLINICAL IMPRESSION(S) / ED DIAGNOSES   Final diagnoses:  Abscess of left thigh  Cellulitis and abscess of left lower extremity     Rx / DC Orders   ED Discharge Orders          Ordered    sulfamethoxazole-trimethoprim (BACTRIM DS) 800-160 MG tablet  2 times daily        02/09/23 0216             Note:  This document was prepared using Dragon voice recognition software and may include unintentional dictation errors.    Pilar Jarvis, MD 02/09/23 [phone]

## 2023-04-25 ENCOUNTER — Other Ambulatory Visit: Payer: Self-pay

## 2023-04-25 ENCOUNTER — Emergency Department
Admission: EM | Admit: 2023-04-25 | Discharge: 2023-04-25 | Disposition: A | Discharge status: Home / Self Care | Payer: MEDICAID | Attending: Emergency Medicine | Admitting: Emergency Medicine

## 2023-04-25 DIAGNOSIS — M79622 Pain in left upper arm: Secondary | ICD-10-CM | POA: Insufficient documentation

## 2023-04-25 MED ORDER — LIDOCAINE 5 % EX PTCH
1.0000 | MEDICATED_PATCH | CUTANEOUS | Status: DC
  Administered 2023-04-25: 1 via TRANSDERMAL
  Filled 2023-04-25: qty 1

## 2023-04-25 MED ORDER — LIDOCAINE 5 % EX PTCH: 1.0000 | MEDICATED_PATCH | Freq: Two times a day (BID) | CUTANEOUS | 0 refills | Status: AC

## 2023-04-25 NOTE — ED Triage Notes (Addendum)
Pt to ed from home via POV for pain and stiffness in her Left arm where her nexplanon is located at. Pt is caox4, in no acute distress and ambulatory in triage. Nexplanon was placed a little over a year ago. No bruising, swelling or redness noted in triage to arm.

## 2023-04-25 NOTE — Discharge Instructions (Signed)
Please use the provided and prescribed Lidoderm patches as written.  You can use over-the-counter ibuprofen and Tylenol as well.  We suggest taking 600 mg of ibuprofen 3 times a day with meals, in addition to alternating with doses of 1000 mg of Tylenol (2 extra strength tablets or capsules) no more often than every 6 hours.  You can use cold packs as well which may help with any pain or swelling you are experiencing.  Follow-up with your primary care provider or with OB/GYN at the next available opportunity to discuss whether or not you should have your Nexplanon device removed.
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
## 2023-04-27 NOTE — Group Note (Deleted)

## 2023-07-07 ENCOUNTER — Other Ambulatory Visit: Payer: Self-pay

## 2023-07-07 DIAGNOSIS — J45909 Unspecified asthma, uncomplicated: Secondary | ICD-10-CM | POA: Insufficient documentation

## 2023-07-07 DIAGNOSIS — H9201 Otalgia, right ear: Secondary | ICD-10-CM | POA: Diagnosis present

## 2023-07-07 DIAGNOSIS — H6691 Otitis media, unspecified, right ear: Secondary | ICD-10-CM | POA: Diagnosis not present

## 2023-07-07 NOTE — ED Triage Notes (Signed)
Pt reports right ear pain that began this morning. Pt reports just getting over URI.

## 2023-07-08 ENCOUNTER — Emergency Department
Admission: EM | Admit: 2023-07-08 | Discharge: 2023-07-08 | Disposition: A | Discharge status: Home / Self Care | Payer: MEDICAID | Attending: Emergency Medicine | Admitting: Emergency Medicine

## 2023-07-08 DIAGNOSIS — H6691 Otitis media, unspecified, right ear: Secondary | ICD-10-CM

## 2023-07-08 MED ORDER — AMOXICILLIN-POT CLAVULANATE 875-125 MG PO TABS: 1.0000 | ORAL_TABLET | Freq: Two times a day (BID) | ORAL | 0 refills | Status: AC

## 2023-07-08 MED ORDER — IBUPROFEN 800 MG PO TABS
800.0000 mg | ORAL_TABLET | Freq: Once | ORAL | Status: AC
Start: 2023-07-08 — End: 2023-07-08
  Administered 2023-07-08: 800 mg via ORAL
  Filled 2023-07-08: qty 1

## 2023-07-08 MED ORDER — HYDROCODONE-ACETAMINOPHEN 5-325 MG PO TABS
2.0000 | ORAL_TABLET | Freq: Once | ORAL | Status: AC
  Administered 2023-07-08: 2 via ORAL
  Filled 2023-07-08: qty 2

## 2023-07-08 MED ORDER — AMOXICILLIN-POT CLAVULANATE 875-125 MG PO TABS
1.0000 | ORAL_TABLET | Freq: Once | ORAL | Status: AC
  Administered 2023-07-08: 1 via ORAL
  Filled 2023-07-08: qty 1

## 2023-07-08 NOTE — Discharge Instructions (Addendum)
You may alternate Tylenol 1000 mg every 6 hours as needed for pain, fever and Ibuprofen 800 mg every 6-8 hours as needed for pain, fever.  Please take Ibuprofen with food.  Do not take more than 4000 mg of Tylenol (acetaminophen) in a 24 hour period. ° °

## 2023-07-08 NOTE — ED Provider Notes (Signed)
[facility] Provider Note    Event Date/Time   First MD Initiated Contact with Patient 07/08/23 0034     (approximate)   History   Otalgia   HPI  NOEL BROOKING is a 27 y.o. female with history of asthma, depression, anxiety who presents to the emergency department the right ear pain.  States she just recently got over a viral URI.  She has seen some blood tinged fluid coming from her ear.  Has had history of previous ear infections.   History provided by patient.    Past Medical History:  Diagnosis Date   Anemia    Anxiety    Asthma    Depression    OCD (obsessive compulsive disorder)    Ovarian cyst    Scoliosis     History reviewed. No pertinent surgical history.  MEDICATIONS:  Prior to Admission medications   Medication Sig Start Date End Date Taking? Authorizing Provider  Ascorbic Acid (VITAMIN C PO) Take 1 tablet by mouth daily.    [provider]  dicyclomine (BENTYL) 10 MG capsule Take 1 capsule (10 mg total) by mouth 3 (three) times daily as needed for up to 14 days for spasms. 01/22/21 02/05/21  Minna Antis, MD  FLUoxetine (PROZAC) 20 MG capsule Take 20 mg by mouth daily. Take one capsule by mouth every morning    [provider]  FLUoxetine (PROZAC) 40 MG capsule Take 40 mg by mouth daily. Take one capsule by mouth every morning    [provider]  HYDROcodone-acetaminophen (NORCO/VICODIN) 5-325 MG tablet Take 1 tablet by mouth every 6 (six) hours as needed for moderate pain. 12/08/22   Fisher, Roselyn Bering, PA-C  hydrOXYzine (VISTARIL) 50 MG capsule Take 50 mg by mouth 3 (three) times daily as needed.    [provider]  ibuprofen (ADVIL,MOTRIN) 800 MG tablet Take 1 tablet (800 mg total) by mouth every 8 (eight) hours as needed. 08/09/17   Fisher, Roselyn Bering, PA-C  lidocaine (LIDODERM) 5 % Place 1 patch onto the skin every 12 (twelve) hours. Remove & Discard patch within 12 hours or as directed by MD.   Wynelle Fanny the patch off for 12 hours before applying a new one. 04/25/23 04/24/24  [name], MD  metroNIDAZOLE (FLAGYL) 500 MG tablet Take 1 tablet (500 mg total) by mouth 2 (two) times daily. 01/01/20   [name], MD  ofloxacin (FLOXIN) 0.3 % OTIC solution Place 5 drops into the left ear daily. 12/08/22   Fisher, Roselyn Bering, PA-C  prazosin (MINIPRESS) 2 MG capsule Take 2 mg by mouth at bedtime.    [provider]  QUEtiapine (SEROQUEL) 25 MG tablet Take 25 mg by mouth in the morning.     [provider]  QUEtiapine (SEROQUEL) 25 MG tablet Take 75 mg by mouth at bedtime.    [provider]  tamsulosin (FLOMAX) 0.4 MG CAPS capsule Take 1 capsule (0.4 mg total) by mouth daily. 12/31/19   [name], MD    Physical Exam   Triage Vital Signs: ED Triage Vitals  Encounter Vitals Group     BP 07/07/23 2121 (!) 128/103     Systolic BP Percentile --      Diastolic BP Percentile --      Pulse Rate 07/07/23 2121 83     Resp 07/07/23 2121 20     Temp 07/07/23 2121 98.5 F (36.9 C)     Temp Source 07/07/23 2121 Oral  SpO2 07/07/23 2121 99 %     Weight 07/07/23 2120 200 lb (90.7 kg)     Height 07/07/23 2120 5\' 4"  (1.626 m)     Head Circumference --      Peak Flow --      Pain Score 07/07/23 2120 10     Pain Loc --      Pain Education --      Exclude from Growth Chart --     Most recent vital signs: Vitals:   07/07/23 2121  BP: (!) 128/103  Pulse: 83  Resp: 20  Temp: 98.5 F (36.9 C)  SpO2: 99%     CONSTITUTIONAL: Alert and responds appropriately to questions.  Peers uncomfortable, tearful, afebrile HEAD: Normocephalic, atraumatic EYES: Conjunctivae clear, pupils appear equal ENT: normal nose; moist mucous membranes; No pharyngeal erythema or petechiae, no tonsillar hypertrophy or exudate, no uvular deviation, no unilateral swelling in posterior oropharynx, no trismus or drooling, no muffled voice, normal phonation, no stridor, airway patent.  Left  TM is clear without erythema, purulence, bulging, perforation, effusion.  Right TM is erythematous, dull without perforation or effusion.  No cerumen impaction or sign of foreign body in the external auditory canal. No inflammation, erythema or drainage from the external auditory canal. No signs of mastoiditis. No pain with manipulation of the pinna bilaterally. NECK: Normal range of motion CARD: Regular rate and rhythm RESP: Normal chest excursion without splinting or tachypnea; no hypoxia or respiratory distress, speaking full sentences ABD/GI: non-distended EXT: Normal ROM in all joints, no major deformities noted SKIN: Normal color for age and race, no rashes on exposed skin NEURO: Moves all extremities equally, normal speech, no facial asymmetry noted PSYCH: The patient's mood and manner are appropriate. Grooming and personal hygiene are appropriate.  ED Results / Procedures / Treatments   LABS: (all labs ordered are listed, but only abnormal results are displayed) Labs Reviewed - No data to display   EKG:  EKG Interpretation Date/Time:    Ventricular Rate:    PR Interval:    QRS Duration:    QT Interval:    QTC Calculation:   R Axis:      Text Interpretation:            RADIOLOGY: My personal review and interpretation of imaging:    I have personally reviewed all radiology reports. No results found.   PROCEDURES:  Critical Care performed: No     Procedures    IMPRESSION / MDM / ASSESSMENT AND PLAN / ED COURSE  I reviewed the triage vital signs and the nursing notes.   Patient here with right-sided otitis media.     DIFFERENTIAL DIAGNOSIS (includes but not limited to):   Otitis media, no sign of otitis externa, no TM perforation, no mastoiditis  Patient's presentation is most consistent with acute complicated illness / injury requiring diagnostic workup.  PLAN: Patient here with otitis media.  Will start her on antibiotics.  Will provide with  pain medication here.  Recommended Tylenol, Motrin at home.   MEDICATIONS GIVEN IN ED: Medications  amoxicillin-clavulanate (AUGMENTIN) 875-125 MG per tablet 1 tablet (1 tablet Oral Given 07/08/23 0046)  HYDROcodone-acetaminophen (NORCO/VICODIN) 5-325 MG per tablet 2 tablet (2 tablets Oral Given 07/08/23 0046)  ibuprofen (ADVIL) tablet 800 mg (800 mg Oral Given 07/08/23 0046)     ED COURSE:  At this time, I do not feel there is any life-threatening condition present. I reviewed all nursing notes, vitals, pertinent previous records.  All  lab and urine results, EKGs, imaging ordered have been independently reviewed and interpreted by myself.  I reviewed all available radiology reports from any imaging ordered this visit.  Based on my assessment, I feel the patient is safe to be discharged home without further emergent workup and can continue workup as an outpatient as needed. Discussed all findings, treatment plan as well as usual and customary return precautions.  They verbalize understanding and are comfortable with this plan.  Outpatient follow-up has been provided as needed.  All questions have been answered.    CONSULTS:  none   OUTSIDE RECORDS REVIEWED: Reviewed previous psychiatric notes at [facility] in August 2020.     FINAL CLINICAL IMPRESSION(S) / ED DIAGNOSES   Final diagnoses:  Acute right otitis media     Rx / DC Orders   ED Discharge Orders          Ordered    amoxicillin-clavulanate (AUGMENTIN) 875-125 MG tablet  2 times daily        07/08/23 0040             Note:  This document was prepared using Dragon voice recognition software and may include unintentional dictation errors.   Tanica Gaige, Layla Maw, DO 07/08/23 0100

## 2023-08-19 ENCOUNTER — Emergency Department: Payer: MEDICAID

## 2023-08-19 ENCOUNTER — Encounter: Payer: Self-pay | Admitting: Emergency Medicine

## 2023-08-19 ENCOUNTER — Emergency Department
Admission: EM | Admit: 2023-08-19 | Discharge: 2023-08-19 | Discharge status: Against Medical Advice | Payer: MEDICAID | Attending: Emergency Medicine | Admitting: Emergency Medicine

## 2023-08-19 DIAGNOSIS — R519 Headache, unspecified: Secondary | ICD-10-CM | POA: Diagnosis present

## 2023-08-19 DIAGNOSIS — Z5321 Procedure and treatment not carried out due to patient leaving prior to being seen by health care provider: Secondary | ICD-10-CM | POA: Diagnosis not present

## 2023-08-19 DIAGNOSIS — H538 Other visual disturbances: Secondary | ICD-10-CM | POA: Diagnosis not present

## 2023-08-19 DIAGNOSIS — M79602 Pain in left arm: Secondary | ICD-10-CM | POA: Diagnosis not present

## 2023-08-19 NOTE — ED Notes (Signed)
 No answer when called several times from lobby
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
## 2023-11-23 ENCOUNTER — Other Ambulatory Visit: Payer: Self-pay

## 2023-11-23 ENCOUNTER — Emergency Department
Admission: EM | Admit: 2023-11-23 | Discharge: 2023-11-23 | Disposition: A | Discharge status: Home / Self Care | Payer: MEDICAID | Attending: Emergency Medicine | Admitting: Emergency Medicine

## 2023-11-23 DIAGNOSIS — L02415 Cutaneous abscess of right lower limb: Secondary | ICD-10-CM | POA: Diagnosis present

## 2023-11-23 DIAGNOSIS — L0291 Cutaneous abscess, unspecified: Secondary | ICD-10-CM

## 2023-11-23 MED ORDER — LIDOCAINE-EPINEPHRINE-TETRACAINE (LET) TOPICAL GEL
3.0000 mL | Freq: Once | TOPICAL | Status: AC
  Administered 2023-11-23: 3 mL via TOPICAL
  Filled 2023-11-23: qty 3

## 2023-11-23 MED ORDER — HYDROCODONE-ACETAMINOPHEN 5-325 MG PO TABS
1.0000 | ORAL_TABLET | Freq: Once | ORAL | Status: AC
  Administered 2023-11-23: 1 via ORAL
  Filled 2023-11-23: qty 1

## 2023-11-23 MED ORDER — SULFAMETHOXAZOLE-TRIMETHOPRIM 800-160 MG PO TABS: 1.0000 | ORAL_TABLET | Freq: Two times a day (BID) | ORAL | 0 refills | Status: DC

## 2023-11-23 MED ORDER — LIDOCAINE HCL (PF) 1 % IJ SOLN
5.0000 mL | Freq: Once | INTRAMUSCULAR | Status: AC
  Administered 2023-11-23: 5 mL via INTRADERMAL
  Filled 2023-11-23: qty 5

## 2023-11-23 MED ORDER — SULFAMETHOXAZOLE-TRIMETHOPRIM 800-160 MG PO TABS
1.0000 | ORAL_TABLET | Freq: Two times a day (BID) | ORAL | 0 refills | Status: AC
Start: 2023-11-23 — End: 2023-11-30

## 2023-11-23 NOTE — ED Triage Notes (Signed)
 Patient states abscess to right inner thigh

## 2023-11-23 NOTE — ED Notes (Signed)
 See triage notes. Patient c/o abscess to right inner thigh

## 2023-11-23 NOTE — Discharge Instructions (Signed)
 Please take the antibiotics as prescribed.  You can take Tylenol and ibuprofen as needed for pain.  Have attached information for dermatology, you can call the office and see if you can schedule an appointment with them.  I have also placed referral for primary care for you and below is information for offices in the area.  Please go to the following website to schedule new (and existing) patient appointments:   [email or web address]   The following is a list of primary care offices in the area who are accepting new patients at this time.  Please reach out to one of them directly and let them know you would like to schedule an appointment to follow up on an Emergency Department visit, and/or to establish a new primary care provider (PCP).  There are likely other primary care clinics in the are who are accepting new patients, but this is an excellent place to start:  [facility] Lead physician: Dr Shirlee Latch [address] [place], [place] [address] [phone]  [facility] Lead Physician: Dr Alba Cory [address], [place], [place] [address] [phone]  [facility]  Lead Physician: Dr Olevia Perches [address] [place], [place] [address] [phone]  [facility] Lead Physician: Dr Sofie Hartigan [address] [place], [place] [address] [phone]  [facility] Lead Physician: Dr Bari Edward [address] [place], [place], [place] [address] [phone]

## 2023-11-23 NOTE — ED Provider Notes (Signed)
 [facility] Provider Note    Event Date/Time   First MD Initiated Contact with Patient 11/23/23 1729     (approximate)   History   Abscess   HPI  Alexandra Koch is a 28 y.o. female  with PMH of anxiety, depression, OCD and hidradenitis suppurativa presents for evaluation of an abscess to the right inner thigh.  Patient states that it developed over the past couple days and has become increasingly painful.  She has tried warm compresses at home which has not been successful in bringing it to ahead.      Physical Exam   Triage Vital Signs: ED Triage Vitals  Encounter Vitals Group     BP 11/23/23 1525 123/86     Systolic BP Percentile --      Diastolic BP Percentile --      Pulse Rate 11/23/23 1525 100     Resp 11/23/23 1525 20     Temp 11/23/23 1525 98.3 F (36.8 C)     Temp Source 11/23/23 1525 Oral     SpO2 11/23/23 1525 98 %     Weight 11/23/23 1523 191 lb (86.6 kg)     Height 11/23/23 1523 5\' 4"  (1.626 m)     Head Circumference --      Peak Flow --      Pain Score 11/23/23 1523 7     Pain Loc --      Pain Education --      Exclude from Growth Chart --     Most recent vital signs: Vitals:   11/23/23 1525  BP: 123/86  Pulse: 100  Resp: 20  Temp: 98.3 F (36.8 C)  SpO2: 98%   General: Awake, no distress. CV:  Good peripheral perfusion. Resp:  Normal effort.  Abd:  No distention.  Other:  Approximately 2 cm in diameter bump to the right medial thigh with overlying erythema, very tender to palpation, fluctuant.   ED Results / Procedures / Treatments   Labs (all labs ordered are listed, but only abnormal results are displayed) Labs Reviewed - No data to display    RADIOLOGY  Bedside soft tissue ultrasound performed to evaluate the extent of the abscess.  PROCEDURES:  Critical Care performed: No  .Incision and Drainage  Date/Time: 11/23/2023 7:19 PM  Performed by: Cameron Ali, PA-C Authorized by: Cameron Ali, PA-C   Consent:    Consent obtained:  Verbal   Consent given by:  Patient   Risks, benefits, and alternatives were discussed: yes     Risks discussed:  Bleeding, incomplete drainage, pain and infection   Alternatives discussed:  No treatment Universal protocol:    Patient identity confirmed:  Verbally with patient Location:    Type:  Abscess   Size:  2 cm   Location:  Lower extremity   Lower extremity location:  Leg   Leg location:  R upper leg Pre-procedure details:    Skin preparation:  Povidone-iodine Sedation:    Sedation type:  None Anesthesia:    Anesthesia method:  Topical application and local infiltration   Topical anesthetic:  LET   Local anesthetic:  Lidocaine 1% w/o epi Procedure type:    Complexity:  Simple Procedure details:    Incision types:  Single straight   Incision depth:  Subcutaneous   Wound management:  Probed and deloculated and irrigated with saline   Drainage:  Bloody and purulent   Drainage amount:  Moderate   Wound  treatment:  Wound left open   Packing materials:  None Post-procedure details:    Procedure completion:  Tolerated well, no immediate complications    MEDICATIONS ORDERED IN ED: Medications  HYDROcodone-acetaminophen (NORCO/VICODIN) 5-325 MG per tablet 1 tablet (1 tablet Oral Given 11/23/23 1745)  lidocaine (PF) (XYLOCAINE) 1 % injection 5 mL (5 mLs Intradermal Given by Other 11/23/23 1753)  lidocaine-EPINEPHrine-tetracaine (LET) topical gel (3 mLs Topical Given 11/23/23 1752)     IMPRESSION / MDM / ASSESSMENT AND PLAN / ED COURSE  I reviewed the triage vital signs and the nursing notes.                             28 year old female presents for evaluation of abscess.  Vital signs are stable patient is very uncomfortable on exam.  Differential diagnosis includes, but is not limited to, folliculitis, abscess's.  Patient's presentation is most consistent with acute, uncomplicated illness.  Patient's presentation is  consistent with an abscess.  She was given oral pain medication prior to incision and drainage.  Please see procedure note for further details.  Patient will be started on oral antibiotics.  We discussed follow-up with dermatology.  She was given information for local offices in the area as well as information on finding a PCP.  Patient voiced understanding, all questions were answered and she was stable at discharge.     FINAL CLINICAL IMPRESSION(S) / ED DIAGNOSES   Final diagnoses:  Abscess     Rx / DC Orders   ED Discharge Orders          Ordered    sulfamethoxazole-trimethoprim (BACTRIM DS) 800-160 MG tablet  2 times daily        11/23/23 1922             Note:  This document was prepared using Dragon voice recognition software and may include unintentional dictation errors.   Cameron Ali, PA-C 11/23/23 Penni Homans, MD 11/23/23 [phone]
